# Patient Record
Sex: Male | Born: 1950
Health system: Southern US, Community
[De-identification: ages and names within clinical notes are randomized; demographics above are authoritative.]

## PROBLEM LIST (undated history)

## (undated) DIAGNOSIS — R011 Cardiac murmur, unspecified: Secondary | ICD-10-CM

## (undated) DIAGNOSIS — E669 Obesity, unspecified: Secondary | ICD-10-CM

## (undated) DIAGNOSIS — E119 Type 2 diabetes mellitus without complications: Secondary | ICD-10-CM

## (undated) DIAGNOSIS — I350 Nonrheumatic aortic (valve) stenosis: Secondary | ICD-10-CM

## (undated) DIAGNOSIS — M169 Osteoarthritis of hip, unspecified: Secondary | ICD-10-CM

## (undated) DIAGNOSIS — Z21 Asymptomatic human immunodeficiency virus [HIV] infection status: Secondary | ICD-10-CM

## (undated) DIAGNOSIS — B20 Human immunodeficiency virus [HIV] disease: Secondary | ICD-10-CM

## (undated) DIAGNOSIS — I10 Essential (primary) hypertension: Secondary | ICD-10-CM

## (undated) HISTORY — DX: Type 2 diabetes mellitus without complications: E11.9

## (undated) HISTORY — DX: Essential (primary) hypertension: I10

## (undated) HISTORY — DX: Cardiac murmur, unspecified: R01.1

## (undated) HISTORY — DX: Asymptomatic human immunodeficiency virus (hiv) infection status: Z21

## (undated) HISTORY — DX: Human immunodeficiency virus (HIV) disease: B20

## (undated) HISTORY — DX: Obesity, unspecified: E66.9

## (undated) HISTORY — DX: Osteoarthritis of hip, unspecified: M16.9

## (undated) HISTORY — DX: Nonrheumatic aortic (valve) stenosis: I35.0

---

## 2007-08-07 DIAGNOSIS — R351 Nocturia: Secondary | ICD-10-CM | POA: Insufficient documentation

## 2009-02-08 DIAGNOSIS — N209 Urinary calculus, unspecified: Secondary | ICD-10-CM | POA: Insufficient documentation

## 2012-03-06 HISTORY — PX: AORTIC VALVE REPLACEMENT: SHX41

## 2015-03-18 LAB — HM HEPATITIS C SCREENING LAB: HM Hepatitis Screen: NEGATIVE

## 2015-04-06 DIAGNOSIS — R768 Other specified abnormal immunological findings in serum: Secondary | ICD-10-CM | POA: Insufficient documentation

## 2015-04-06 DIAGNOSIS — R894 Abnormal immunological findings in specimens from other organs, systems and tissues: Secondary | ICD-10-CM | POA: Insufficient documentation

## 2015-04-28 DIAGNOSIS — E1129 Type 2 diabetes mellitus with other diabetic kidney complication: Secondary | ICD-10-CM | POA: Insufficient documentation

## 2015-04-28 DIAGNOSIS — D649 Anemia, unspecified: Secondary | ICD-10-CM | POA: Insufficient documentation

## 2015-04-28 DIAGNOSIS — R809 Proteinuria, unspecified: Secondary | ICD-10-CM

## 2016-04-18 DIAGNOSIS — Z952 Presence of prosthetic heart valve: Secondary | ICD-10-CM | POA: Diagnosis not present

## 2016-04-18 DIAGNOSIS — Z23 Encounter for immunization: Secondary | ICD-10-CM | POA: Diagnosis not present

## 2016-04-18 DIAGNOSIS — I10 Essential (primary) hypertension: Secondary | ICD-10-CM | POA: Diagnosis not present

## 2016-04-18 DIAGNOSIS — E119 Type 2 diabetes mellitus without complications: Secondary | ICD-10-CM | POA: Diagnosis not present

## 2016-04-18 DIAGNOSIS — B20 Human immunodeficiency virus [HIV] disease: Secondary | ICD-10-CM | POA: Diagnosis not present

## 2016-04-18 DIAGNOSIS — F0280 Dementia in other diseases classified elsewhere without behavioral disturbance: Secondary | ICD-10-CM | POA: Diagnosis not present

## 2016-04-18 DIAGNOSIS — E118 Type 2 diabetes mellitus with unspecified complications: Secondary | ICD-10-CM | POA: Diagnosis not present

## 2016-04-24 DIAGNOSIS — Z794 Long term (current) use of insulin: Secondary | ICD-10-CM | POA: Diagnosis not present

## 2016-04-24 DIAGNOSIS — I10 Essential (primary) hypertension: Secondary | ICD-10-CM | POA: Diagnosis not present

## 2016-04-24 DIAGNOSIS — I350 Nonrheumatic aortic (valve) stenosis: Secondary | ICD-10-CM | POA: Diagnosis not present

## 2016-04-24 DIAGNOSIS — Z6841 Body Mass Index (BMI) 40.0 and over, adult: Secondary | ICD-10-CM | POA: Diagnosis not present

## 2016-04-24 DIAGNOSIS — R809 Proteinuria, unspecified: Secondary | ICD-10-CM | POA: Diagnosis not present

## 2016-04-24 DIAGNOSIS — M25552 Pain in left hip: Secondary | ICD-10-CM | POA: Diagnosis not present

## 2016-04-24 DIAGNOSIS — E1129 Type 2 diabetes mellitus with other diabetic kidney complication: Secondary | ICD-10-CM | POA: Diagnosis not present

## 2016-05-22 DIAGNOSIS — R809 Proteinuria, unspecified: Secondary | ICD-10-CM | POA: Diagnosis not present

## 2016-05-22 DIAGNOSIS — E1129 Type 2 diabetes mellitus with other diabetic kidney complication: Secondary | ICD-10-CM | POA: Diagnosis not present

## 2016-05-22 DIAGNOSIS — Z794 Long term (current) use of insulin: Secondary | ICD-10-CM | POA: Diagnosis not present

## 2016-05-22 DIAGNOSIS — I10 Essential (primary) hypertension: Secondary | ICD-10-CM | POA: Diagnosis not present

## 2016-06-06 DIAGNOSIS — M7062 Trochanteric bursitis, left hip: Secondary | ICD-10-CM | POA: Diagnosis not present

## 2016-08-09 DIAGNOSIS — M25552 Pain in left hip: Secondary | ICD-10-CM | POA: Diagnosis not present

## 2016-08-09 DIAGNOSIS — B20 Human immunodeficiency virus [HIV] disease: Secondary | ICD-10-CM | POA: Diagnosis not present

## 2016-08-09 DIAGNOSIS — I10 Essential (primary) hypertension: Secondary | ICD-10-CM | POA: Diagnosis not present

## 2016-08-09 DIAGNOSIS — E118 Type 2 diabetes mellitus with unspecified complications: Secondary | ICD-10-CM | POA: Diagnosis not present

## 2016-09-01 DIAGNOSIS — M7062 Trochanteric bursitis, left hip: Secondary | ICD-10-CM | POA: Diagnosis not present

## 2016-09-01 DIAGNOSIS — I10 Essential (primary) hypertension: Secondary | ICD-10-CM | POA: Diagnosis not present

## 2016-09-01 DIAGNOSIS — M7072 Other bursitis of hip, left hip: Secondary | ICD-10-CM | POA: Diagnosis not present

## 2016-09-13 DIAGNOSIS — M25552 Pain in left hip: Secondary | ICD-10-CM | POA: Diagnosis not present

## 2016-09-13 DIAGNOSIS — M7072 Other bursitis of hip, left hip: Secondary | ICD-10-CM | POA: Diagnosis not present

## 2016-09-18 DIAGNOSIS — M7072 Other bursitis of hip, left hip: Secondary | ICD-10-CM | POA: Diagnosis not present

## 2016-09-18 DIAGNOSIS — M25552 Pain in left hip: Secondary | ICD-10-CM | POA: Diagnosis not present

## 2016-09-25 DIAGNOSIS — M7072 Other bursitis of hip, left hip: Secondary | ICD-10-CM | POA: Diagnosis not present

## 2016-09-25 DIAGNOSIS — M25552 Pain in left hip: Secondary | ICD-10-CM | POA: Diagnosis not present

## 2016-10-02 DIAGNOSIS — M25552 Pain in left hip: Secondary | ICD-10-CM | POA: Diagnosis not present

## 2016-10-02 DIAGNOSIS — M7072 Other bursitis of hip, left hip: Secondary | ICD-10-CM | POA: Diagnosis not present

## 2016-10-09 DIAGNOSIS — R531 Weakness: Secondary | ICD-10-CM | POA: Diagnosis not present

## 2016-10-09 DIAGNOSIS — M25652 Stiffness of left hip, not elsewhere classified: Secondary | ICD-10-CM | POA: Diagnosis not present

## 2016-10-09 DIAGNOSIS — M7072 Other bursitis of hip, left hip: Secondary | ICD-10-CM | POA: Diagnosis not present

## 2016-10-10 DIAGNOSIS — E118 Type 2 diabetes mellitus with unspecified complications: Secondary | ICD-10-CM | POA: Diagnosis not present

## 2016-10-10 DIAGNOSIS — B20 Human immunodeficiency virus [HIV] disease: Secondary | ICD-10-CM | POA: Diagnosis not present

## 2016-10-10 DIAGNOSIS — I10 Essential (primary) hypertension: Secondary | ICD-10-CM | POA: Diagnosis not present

## 2016-10-10 DIAGNOSIS — Z952 Presence of prosthetic heart valve: Secondary | ICD-10-CM | POA: Diagnosis not present

## 2016-10-14 LAB — HIV ANTIBODY (ROUTINE TESTING W REFLEX)

## 2016-11-07 DIAGNOSIS — R52 Pain, unspecified: Secondary | ICD-10-CM | POA: Diagnosis not present

## 2016-11-07 DIAGNOSIS — M1611 Unilateral primary osteoarthritis, right hip: Secondary | ICD-10-CM | POA: Diagnosis not present

## 2016-11-07 DIAGNOSIS — M1612 Unilateral primary osteoarthritis, left hip: Secondary | ICD-10-CM | POA: Diagnosis not present

## 2016-11-07 DIAGNOSIS — I10 Essential (primary) hypertension: Secondary | ICD-10-CM | POA: Diagnosis not present

## 2016-11-07 DIAGNOSIS — M7061 Trochanteric bursitis, right hip: Secondary | ICD-10-CM | POA: Diagnosis not present

## 2016-11-07 DIAGNOSIS — E669 Obesity, unspecified: Secondary | ICD-10-CM | POA: Diagnosis not present

## 2017-01-03 DIAGNOSIS — Z23 Encounter for immunization: Secondary | ICD-10-CM | POA: Diagnosis not present

## 2017-01-29 DIAGNOSIS — Z6841 Body Mass Index (BMI) 40.0 and over, adult: Secondary | ICD-10-CM | POA: Diagnosis not present

## 2017-01-29 DIAGNOSIS — E1100 Type 2 diabetes mellitus with hyperosmolarity without nonketotic hyperglycemic-hyperosmolar coma (NKHHC): Secondary | ICD-10-CM | POA: Diagnosis not present

## 2017-01-29 DIAGNOSIS — E559 Vitamin D deficiency, unspecified: Secondary | ICD-10-CM | POA: Diagnosis not present

## 2017-01-29 DIAGNOSIS — G4730 Sleep apnea, unspecified: Secondary | ICD-10-CM | POA: Diagnosis not present

## 2017-01-29 DIAGNOSIS — I10 Essential (primary) hypertension: Secondary | ICD-10-CM | POA: Diagnosis not present

## 2017-01-29 LAB — HEMOGLOBIN A1C: HEMOGLOBIN A1C: 6.8

## 2017-03-12 ENCOUNTER — Encounter (INDEPENDENT_AMBULATORY_CARE_PROVIDER_SITE_OTHER): Payer: Self-pay

## 2017-03-12 ENCOUNTER — Ambulatory Visit (INDEPENDENT_AMBULATORY_CARE_PROVIDER_SITE_OTHER): Payer: BLUE CROSS/BLUE SHIELD | Admitting: Physician Assistant

## 2017-03-12 ENCOUNTER — Encounter: Payer: Self-pay | Admitting: Physician Assistant

## 2017-03-12 VITALS — BP 135/91 | HR 88 | Ht 69.0 in | Wt 290.9 lb

## 2017-03-12 DIAGNOSIS — R809 Proteinuria, unspecified: Secondary | ICD-10-CM

## 2017-03-12 DIAGNOSIS — Z7689 Persons encountering health services in other specified circumstances: Secondary | ICD-10-CM | POA: Diagnosis not present

## 2017-03-12 DIAGNOSIS — I10 Essential (primary) hypertension: Secondary | ICD-10-CM | POA: Diagnosis not present

## 2017-03-12 DIAGNOSIS — Z9889 Other specified postprocedural states: Secondary | ICD-10-CM

## 2017-03-12 DIAGNOSIS — E1129 Type 2 diabetes mellitus with other diabetic kidney complication: Secondary | ICD-10-CM | POA: Insufficient documentation

## 2017-03-12 DIAGNOSIS — B20 Human immunodeficiency virus [HIV] disease: Secondary | ICD-10-CM | POA: Diagnosis not present

## 2017-03-12 DIAGNOSIS — E1169 Type 2 diabetes mellitus with other specified complication: Secondary | ICD-10-CM

## 2017-03-12 DIAGNOSIS — E785 Hyperlipidemia, unspecified: Secondary | ICD-10-CM

## 2017-03-12 MED ORDER — VALSARTAN 160 MG PO TABS: 160.0000 mg | ORAL_TABLET | Freq: Every day | ORAL | 3 refills | Status: DC

## 2017-03-12 MED ORDER — ATORVASTATIN CALCIUM 10 MG PO TABS: 10.0000 mg | ORAL_TABLET | Freq: Every day | ORAL | 1 refills | Status: DC

## 2017-03-12 MED ORDER — CARVEDILOL 12.5 MG PO TABS: 12.5000 mg | ORAL_TABLET | Freq: Two times a day (BID) | ORAL | 3 refills | Status: DC

## 2017-03-12 NOTE — Progress Notes (Signed)
HPI:                                                                Clifford Strong is a 67 y.o. male who presents to [facility]: [facility] today to establish care  Current concerns: none  HIV: he is on protease inhibitor therapy. He is also on bactrim for OI prophylaxis. Followed by Dr. Lequita Halt, [facility].  HTN: prescribed Carvedilol 12.5 mg bid and Valsartan-HCTZ. Reports he ran out of his BP meds months ago because he did not have a PCP. Checks BP's at home. BP range 140's, unsure of diastolic. Denies vision change, headache, chest pain with exertion, orthopnea, lightheadedness, syncope and edema. Risk factors include: DM II, age >27, male sex, morbid obesity  DMII: taking Jenumet and Saxenda daily. Compliant with medications. Last A1C 6.8, 01/2017. Checks blood sugars at home. No outside readings to report today. Denies polyuria, vision change, and paresthesias. Denies hypoglycemic events. Denies ulcers/wounds on feet.   Past Medical History:  Diagnosis Date  . Aortic stenosis   . Diabetes mellitus without complication (HCC)   . Heart murmur   . HIV infection (HCC)   . Hypertension   . Obesity    Past Surgical History:  Procedure Laterality Date  . AORTIC VALVE REPLACEMENT  2014   Social History   Tobacco Use  . Smoking status: Never Smoker  . Smokeless tobacco: Never Used  Substance Use Topics  . Alcohol use: No    Frequency: Never   family history includes Diabetes in his father and mother; Heart attack in his mother; Hypertension in his father and mother; Lung disease in his father.  ROS: negative except as noted in the HPI  Medications: Current Outpatient Medications  Medication Sig Dispense Refill  . aspirin EC 81 MG tablet Take by mouth.    . carvedilol (COREG) 12.5 MG tablet Take 1 tablet (12.5 mg total) by mouth 2 (two) times daily with a meal. 60 tablet 3  .  elvitegravir-cobicistat-emtricitabine-tenofovir (GENVOYA) 150-150-200-10 MG TABS tablet Take by mouth.    Marland Kitchen SAXENDA 18 MG/3ML SOPN INJECT 3MG  DAILY  1  . SitaGLIPtin-MetFORMIN HCl (JANUMET XR) 50-1000 MG TB24 TAKE 1TABLET DAILY WITH DINNER.    Marland Kitchen sulfamethoxazole-trimethoprim (BACTRIM DS,SEPTRA DS) 800-160 MG tablet Take by mouth.    . valsartan-hydrochlorothiazide (DIOVAN-HCT) 320-12.5 MG tablet Take 1 tablet by mouth daily.    Marland Kitchen atorvastatin (LIPITOR) 10 MG tablet Take 1 tablet (10 mg total) by mouth at bedtime. 90 tablet 1  . valsartan (DIOVAN) 160 MG tablet Take 1 tablet (160 mg total) by mouth daily. 30 tablet 3   No current facility-administered medications for this visit.    Allergies  Allergen Reactions  . Lisinopril Cough       Objective:  BP (!) 135/91 (BP Location: Right Arm, Patient Position: Sitting, Cuff Size: Large)   Pulse 88   Ht 5\' 9"  (1.753 m)   Wt 290 lb 14.4 oz (132 kg)   BMI 42.96 kg/m  Gen:  alert, not ill-appearing, no distress, appropriate for age, morbidly obese male HEENT: head normocephalic without obvious abnormality, conjunctiva and cornea clear, trachea midline Pulm: Normal work of breathing, normal phonation, clear to auscultation bilaterally, no wheezes, rales  or rhonchi CV: Normal rate, regular rhythm, s1 and s2 distinct, grade II systolic murmur, no clicks or rubs, no carotid bruit Neuro: alert and oriented x 3, no tremor MSK: extremities atraumatic, normal gait and station, no peripheral edema Skin: intact, no rashes on exposed skin, no jaundice, no cyanosis Psych: well-groomed, cooperative, good eye contact, euthymic mood, affect mood-congruent, speech is articulate, and thought processes clear and goal-directed  [facility]  Decreased Interest 0  Down, Depressed, Hopeless 0  PHQ - 2 Score 0     No results found for this or any previous visit (from the past 72 hour(s)). No results found.    Assessment and  Plan: 67 y.o. male with   1. Encounter to establish care - Reviewed PMH, PSH, PFH, medications and allergies - Reviewed health maintenance Immunizations UTD Has never had any form of colon cancer screening Reviewed outside labs from 01/30/2017 PHQ2 negative  2. HIV disease (HCC) - keep follow-up with ID [id number]  3. Uncontrolled hypertension BP Readings from Last 3 Encounters:  03/12/17 (!) 135/91  - restarting ARB and beta blocker - counseled on therapeutic lifestyle changes - continue baby asa for primary prevention - carvedilol (COREG) 12.5 MG tablet; Take 1 tablet (12.5 mg total) by mouth 2 (two) times daily with a meal.  Dispense: 60 tablet; Refill: 3 - valsartan (DIOVAN) 160 MG tablet; Take 1 tablet (160 mg total) by mouth daily.  Dispense: 30 tablet; Refill: 3  4. Status post aortic valve repair - carvedilol (COREG) 12.5 MG tablet; Take 1 tablet (12.5 mg total) by mouth 2 (two) times daily with a meal.  Dispense: 60 tablet; Refill: 3  5. Dyslipidemia associated with type 2 diabetes mellitus (HCC) - last lipid panel 04/19/16 showed LDL 129, TC 213, HDL 37, TG 236 - goal LDL <16<70 - starting low-dose Atorvastatin, which should be safe with protease inhibitor. Monitor liver function Q3-606months - atorvastatin (LIPITOR) 10 MG tablet; Take 1 tablet (10 mg total) by mouth at bedtime.  Dispense: 90 tablet; Refill: 1  6. Controlled type 2 diabetes mellitus with microalbuminuria, without long-term current use of insulin (HCC) - A1c 6.8, 01/29/17 - continue daily meds - re-starting ARB - starting statin - return in 4 weeks for diabetic preventive care and A1c  Patient education and anticipatory guidance given Patient agrees with treatment plan Follow-up in 4 weeks for hypertension or sooner as needed if symptoms worsen or fail to improve  Levonne Hubertharley E. Edon Hoadley PA-C

## 2017-03-12 NOTE — Patient Instructions (Signed)
For your blood pressure: - Restart your Carvedilol twice a day - Restart your Valsartan once daily in the morning - Start Atorvastatin at bedtime to lower cholesterol and prevent heart attack/stroke - Continue baby aspirin 81 mg to help prevent heart attack/stroke - Check blood pressure at home for the next 2 weeks and log your readings - Check around the same time each day in a relaxed setting - Limit salt to <1500 mg/day - Follow DASH eating plan - limit alcohol to 2 standard drinks per day - avoid tobacco products - weight loss: 7% of current body weight - Follow-up in 2 weeks for nurse BP check

## 2017-03-16 ENCOUNTER — Encounter: Payer: Self-pay | Admitting: Physician Assistant

## 2017-03-19 DIAGNOSIS — I10 Essential (primary) hypertension: Secondary | ICD-10-CM | POA: Diagnosis not present

## 2017-03-19 DIAGNOSIS — E1100 Type 2 diabetes mellitus with hyperosmolarity without nonketotic hyperglycemic-hyperosmolar coma (NKHHC): Secondary | ICD-10-CM | POA: Diagnosis not present

## 2017-03-19 DIAGNOSIS — Z6841 Body Mass Index (BMI) 40.0 and over, adult: Secondary | ICD-10-CM | POA: Diagnosis not present

## 2017-04-02 ENCOUNTER — Encounter: Payer: BLUE CROSS/BLUE SHIELD | Admitting: Physician Assistant

## 2017-04-04 ENCOUNTER — Ambulatory Visit (INDEPENDENT_AMBULATORY_CARE_PROVIDER_SITE_OTHER): Payer: BLUE CROSS/BLUE SHIELD | Admitting: Physician Assistant

## 2017-04-04 ENCOUNTER — Encounter: Payer: Self-pay | Admitting: Physician Assistant

## 2017-04-04 VITALS — BP 157/78 | HR 80 | Wt 290.0 lb

## 2017-04-04 DIAGNOSIS — Z0000 Encounter for general adult medical examination without abnormal findings: Secondary | ICD-10-CM | POA: Diagnosis not present

## 2017-04-04 DIAGNOSIS — B20 Human immunodeficiency virus [HIV] disease: Secondary | ICD-10-CM | POA: Diagnosis not present

## 2017-04-04 DIAGNOSIS — Z5181 Encounter for therapeutic drug level monitoring: Secondary | ICD-10-CM | POA: Diagnosis not present

## 2017-04-04 DIAGNOSIS — E1159 Type 2 diabetes mellitus with other circulatory complications: Secondary | ICD-10-CM

## 2017-04-04 DIAGNOSIS — I10 Essential (primary) hypertension: Secondary | ICD-10-CM | POA: Diagnosis not present

## 2017-04-04 DIAGNOSIS — I152 Hypertension secondary to endocrine disorders: Secondary | ICD-10-CM | POA: Insufficient documentation

## 2017-04-04 DIAGNOSIS — B351 Tinea unguium: Secondary | ICD-10-CM

## 2017-04-04 DIAGNOSIS — Z1211 Encounter for screening for malignant neoplasm of colon: Secondary | ICD-10-CM

## 2017-04-04 DIAGNOSIS — E559 Vitamin D deficiency, unspecified: Secondary | ICD-10-CM | POA: Diagnosis not present

## 2017-04-04 DIAGNOSIS — E1169 Type 2 diabetes mellitus with other specified complication: Secondary | ICD-10-CM

## 2017-04-04 DIAGNOSIS — E119 Type 2 diabetes mellitus without complications: Secondary | ICD-10-CM

## 2017-04-04 DIAGNOSIS — E785 Hyperlipidemia, unspecified: Secondary | ICD-10-CM

## 2017-04-04 DIAGNOSIS — Z79899 Other long term (current) drug therapy: Secondary | ICD-10-CM | POA: Insufficient documentation

## 2017-04-04 DIAGNOSIS — Z6841 Body Mass Index (BMI) 40.0 and over, adult: Secondary | ICD-10-CM

## 2017-04-04 MED ORDER — ERGOCALCIFEROL 50 MCG (2000 UT) PO CAPS: 1.0000 | ORAL_CAPSULE | Freq: Every day | ORAL | Status: DC

## 2017-04-04 MED ORDER — VALSARTAN 320 MG PO TABS: 320.0000 mg | ORAL_TABLET | Freq: Every day | ORAL | 1 refills | Status: DC

## 2017-04-04 NOTE — Patient Instructions (Addendum)
For your blood pressure: - Increasing Valsartan to 320 mg daily - Goal <130/80 - baby aspirin 81 mg daily to help prevent heart attack/stroke - monitor and log blood pressures at home - check around the same time each day in a relaxed setting - Limit salt to <2000 mg/day - Follow DASH eating plan - limit alcohol to 2 standard drinks per day for men and 1 per day for women - avoid tobacco products - weight loss: 7% of current body weight - follow-up every 6 months for your blood pressure   Fat and Cholesterol Restricted Diet High levels of fat and cholesterol in your blood may lead to various health problems, such as diseases of the heart, blood vessels, gallbladder, liver, and pancreas. Fats are concentrated sources of energy that come in various forms. Certain types of fat, including saturated fat, may be harmful in excess. Cholesterol is a substance needed by your body in small amounts. Your body makes all the cholesterol it needs. Excess cholesterol comes from the food you eat. When you have high levels of cholesterol and saturated fat in your blood, health problems can develop because the excess fat and cholesterol will gather along the walls of your blood vessels, causing them to narrow. Choosing the right foods will help you control your intake of fat and cholesterol. This will help keep the levels of these substances in your blood within normal limits and reduce your risk of disease. What is my plan? Your health care provider recommends that you:  Limit your fat intake to ______% or less of your total calories per day.  Limit the amount of cholesterol in your diet to less than _________mg per day.  Eat 20-30 grams of fiber each day.  What types of fat should I choose?  Choose healthy fats more often. Choose monounsaturated and polyunsaturated fats, such as olive and canola oil, flaxseeds, walnuts, almonds, and seeds.  Eat more omega-3 fats. Good choices include salmon, mackerel,  sardines, tuna, flaxseed oil, and ground flaxseeds. Aim to eat fish at least two times a week.  Limit saturated fats. Saturated fats are primarily found in animal products, such as meats, butter, and cream. Plant sources of saturated fats include palm oil, palm kernel oil, and coconut oil.  Avoid foods with partially hydrogenated oils in them. These contain trans fats. Examples of foods that contain trans fats are stick margarine, some tub margarines, cookies, crackers, and other baked goods. What general guidelines do I need to follow? These guidelines for healthy eating will help you control your intake of fat and cholesterol:  Check food labels carefully to identify foods with trans fats or high amounts of saturated fat.  Fill one half of your plate with vegetables and green salads.  Fill one fourth of your plate with whole grains. Look for the word "whole" as the first word in the ingredient list.  Fill one fourth of your plate with lean protein foods.  Limit fruit to two servings a day. Choose fruit instead of juice.  Eat more foods that contain fiber, such as apples, broccoli, carrots, beans, peas, and barley.  Eat more home-cooked food and less restaurant, buffet, and fast food.  Limit or avoid alcohol.  Limit foods high in starch and sugar.  Limit fried foods.  Cook foods using methods other than frying. Baking, boiling, grilling, and broiling are all great options.  Lose weight if you are overweight. Losing just 5-10% of your initial body weight can help your overall  health and prevent diseases such as diabetes and heart disease.  What foods can I eat? Grains  Whole grains, such as whole wheat or whole grain breads, crackers, cereals, and pasta. Unsweetened oatmeal, bulgur, barley, quinoa, or brown rice. Corn or whole wheat flour tortillas. Vegetables  Fresh or frozen vegetables (raw, steamed, roasted, or grilled). Green salads. Fruits  All fresh, canned (in natural  juice), or frozen fruits. Meats and other protein foods  Ground beef (85% or leaner), grass-fed beef, or beef trimmed of fat. Skinless chicken or [place]. Ground chicken or [place]. Pork trimmed of fat. All fish and seafood. Eggs. Dried beans, peas, or lentils. Unsalted nuts or seeds. Unsalted canned or dry beans. Dairy  Low-fat dairy products, such as skim or 1% milk, 2% or reduced-fat cheeses, low-fat ricotta or cottage cheese, or plain low-fat yo Fats and oils  Tub margarines without trans fats. Light or reduced-fat mayonnaise and salad dressings. Avocado. Olive, canola, sesame, or safflower oils. Natural peanut or almond butter (choose ones without added sugar and oil). The items listed above may not be a complete list of recommended foods or beverages. Contact your dietitian for more options. Foods to avoid Grains  White bread. White pasta. White rice. Cornbread. Bagels, pastries, and croissants. Crackers that contain trans fat. Vegetables  White potatoes. Corn. Creamed or fried vegetables. Vegetables in a cheese sauce. Fruits  Dried fruits. Canned fruit in light or heavy syrup. Fruit juice. Meats and other protein foods  Fatty cuts of meat. Ribs, chicken wings, bacon, sausage, bologna, salami, chitterlings, fatback, hot dogs, bratwurst, and packaged luncheon meats. Liver and organ meats. Dairy  Whole or 2% milk, cream, half-and-half, and cream cheese. Whole milk cheeses. Whole-fat or sweetened yogurt. Full-fat cheeses. Nondairy creamers and whipped toppings. Processed cheese, cheese spreads, or cheese curds. Beverages  Alcohol. Sweetened drinks (such as sodas, lemonade, and fruit drinks or punches). Fats and oils  Butter, stick margarine, lard, shortening, ghee, or bacon fat. Coconut, palm kernel, or palm oils. Sweets and desserts  Corn syrup, sugars, honey, and molasses. Candy. Jam and jelly. Syrup. Sweetened cereals. Cookies, pies, cakes, donuts, muffins, and ice cream. The  items listed above may not be a complete list of foods and beverages to avoid. Contact your dietitian for more information. This information is not intended to replace advice given to you by your health care provider. Make sure you discuss any questions you have with your health care provider. Document Released: 02/20/2005 Document Revised: 03/13/2014 Document Reviewed: 05/21/2013 Elsevier Interactive Patient Education  2018 [employer].   Diabetes and Foot Care Diabetes may cause you to have problems because of poor blood supply (circulation) to your feet and legs. This may cause the skin on your feet to become thinner, break easier, and heal more slowly. Your skin may become dry, and the skin may peel and crack. You may also have nerve damage in your legs and feet causing decreased feeling in them. You may not notice minor injuries to your feet that could lead to infections or more serious problems. Taking care of your feet is one of the most important things you can do for yourself. Follow these instructions at home:  Wear shoes at all times, even in the house. Do not go barefoot. Bare feet are easily injured.  Check your feet daily for blisters, cuts, and redness. If you cannot see the bottom of your feet, use a mirror or ask someone for help.  Wash your feet with warm water (do  not use hot water) and mild soap. Then pat your feet and the areas between your toes until they are completely dry. Do not soak your feet as this can dry your skin.  Apply a moisturizing lotion or petroleum jelly (that does not contain alcohol and is unscented) to the skin on your feet and to dry, brittle toenails. Do not apply lotion between your toes.  Trim your toenails straight across. Do not dig under them or around the cuticle. File the edges of your nails with an emery board or nail file.  Do not cut corns or calluses or try to remove them with medicine.  Wear clean socks or stockings every day. Make sure  they are not too tight. Do not wear knee-high stockings since they may decrease blood flow to your legs.  Wear shoes that fit properly and have enough cushioning. To break in new shoes, wear them for just a few hours a day. This prevents you from injuring your feet. Always look in your shoes before you put them on to be sure there are no objects inside.  Do not cross your legs. This may decrease the blood flow to your feet.  If you find a minor scrape, cut, or break in the skin on your feet, keep it and the skin around it clean and dry. These areas may be cleansed with mild soap and water. Do not cleanse the area with peroxide, alcohol, or iodine.  When you remove an adhesive bandage, be sure not to damage the skin around it.  If you have a wound, look at it several times a day to make sure it is healing.  Do not use heating pads or hot water bottles. They may burn your skin. If you have lost feeling in your feet or legs, you may not know it is happening until it is too late.  Make sure your health care provider performs a complete foot exam at least annually or more often if you have foot problems. Report any cuts, sores, or bruises to your health care provider immediately. Contact a health care provider if:  You have an injury that is not healing.  You have cuts or breaks in the skin.  You have an ingrown nail.  You notice redness on your legs or feet.  You feel burning or tingling in your legs or feet.  You have pain or cramps in your legs and feet.  Your legs or feet are numb.  Your feet always feel cold. Get help right away if:  There is increasing redness, swelling, or pain in or around a wound.  There is a red line that goes up your leg.  Pus is coming from a wound.  You develop a fever or as directed by your health care provider.  You notice a bad smell coming from an ulcer or wound. This information is not intended to replace advice given to you by your health care  provider. Make sure you discuss any questions you have with your health care provider. Document Released: 02/18/2000 Document Revised: 07/29/2015 Document Reviewed: 07/30/2012 Elsevier Interactive Patient Education  2017 Elsevier Inc.   Glaucoma Glaucoma happens when the fluid pressure in the eyeball is too high. If the pressure stays high for too long, the eye may become damaged. This can cause a loss of vision. The most common type of glaucoma causes pressure in the eye to go up slowly. There may be no symptoms at first. Testing for this condition can  help to find the condition before damage occurs. Early treatment can often stop vision loss. Follow these instructions at home:  Take medicines only as told by your doctor.  Use your eye drops exactly as told. You will probably need to use these for the rest of your life.  Exercise often. Talk with your doctor about which types of exercise are safe for you. Avoid standing on your head.  Keep all follow-up visits as told by your doctor. This is important. Contact a doctor if:  Your symptoms get worse. Get help right away if:  You have bad pain in your eye.  You have vision problems.  You have a bad headache in the area around your eye.  You feel sick to your stomach (nauseous) or you throw up (vomit).  You start to have problems with your other eye. This information is not intended to replace advice given to you by your health care provider. Make sure you discuss any questions you have with your health care provider. Document Released: 11/30/2007 Document Revised: 07/29/2015 Document Reviewed: 12/02/2013 Elsevier Interactive Patient Education  2018 [employer].   Health Maintenance, Male A healthy lifestyle and preventive care is important for your health and wellness. Ask your health care provider about what schedule of regular examinations is right for you. What should I know about weight and diet? Eat a Healthy Diet  Eat  plenty of vegetables, fruits, whole grains, low-fat dairy products, and lean protein.  Do not eat a lot of foods high in solid fats, added sugars, or salt.  Maintain a Healthy Weight Regular exercise can help you achieve or maintain a healthy weight. You should:  Do at least 150 minutes of exercise each week. The exercise should increase your heart rate and make you sweat (moderate-intensity exercise).  Do strength-training exercises at least twice a week.  Watch Your Levels of Cholesterol and Blood Lipids  Have your blood tested for lipids and cholesterol every 5 years starting at 67 years of age. If you are at high risk for heart disease, you should start having your blood tested when you are 68 years old. You may need to have your cholesterol levels checked more often if: ? Your lipid or cholesterol levels are high. ? You are older than 68 years of age. ? You are at high risk for heart disease.  What should I know about cancer screening? Many types of cancers can be detected early and may often be prevented. Lung Cancer  You should be screened every year for lung cancer if: ? You are a current smoker who has smoked for at least 30 years. ? You are a former smoker who has quit within the past 15 years.  Talk to your health care provider about your screening options, when you should start screening, and how often you should be screened.  Colorectal Cancer  Routine colorectal cancer screening usually begins at 68 years of age and should be repeated every 5-10 years until you are 68 years old. You may need to be screened more often if early forms of precancerous polyps or small growths are found. Your health care provider may recommend screening at an earlier age if you have risk factors for colon cancer.  Your health care provider may recommend using home test kits to check for hidden blood in the stool.  A small camera at the end of a tube can be used to examine your colon  (sigmoidoscopy or colonoscopy). This checks for the  earliest forms of colorectal cancer.  Prostate and Testicular Cancer  Depending on your age and overall health, your health care provider may do certain tests to screen for prostate and testicular cancer.  Talk to your health care provider about any symptoms or concerns you have about testicular or prostate cancer.  Skin Cancer  Check your skin from head to toe regularly.  Tell your health care provider about any new moles or changes in moles, especially if: ? There is a change in a mole's size, shape, or color. ? You have a mole that is larger than a pencil eraser.  Always use sunscreen. Apply sunscreen liberally and repeat throughout the day.  Protect yourself by wearing long sleeves, pants, a wide-brimmed hat, and sunglasses when outside.  What should I know about heart disease, diabetes, and high blood pressure?  If you are 71-27 years of age, have your blood pressure checked every 3-5 years. If you are 36 years of age or older, have your blood pressure checked every year. You should have your blood pressure measured twice-once when you are at a hospital or clinic, and once when you are not at a hospital or clinic. Record the average of the two measurements. To check your blood pressure when you are not at a hospital or clinic, you can use: ? An automated blood pressure machine at a pharmacy. ? A home blood pressure monitor.  Talk to your health care provider about your target blood pressure.  If you are between 10-69 years old, ask your health care provider if you should take aspirin to prevent heart disease.  Have regular diabetes screenings by checking your fasting blood sugar level. ? If you are at a normal weight and have a low risk for diabetes, have this test once every three years after the age of 80. ? If you are overweight and have a high risk for diabetes, consider being tested at a younger age or more often.  A  one-time screening for abdominal aortic aneurysm (AAA) by ultrasound is recommended for men aged 65-75 years who are current or former smokers. What should I know about preventing infection? Hepatitis B If you have a higher risk for hepatitis B, you should be screened for this virus. Talk with your health care provider to find out if you are at risk for hepatitis B infection. Hepatitis C Blood testing is recommended for:  Everyone born from [birth year] through 1966.  Anyone with known risk factors for hepatitis C.  Sexually Transmitted Diseases (STDs)  You should be screened each year for STDs including gonorrhea and chlamydia if: ? You are sexually active and are younger than 68 years of age. ? You are older than 68 years of age and your health care provider tells you that you are at risk for this type of infection. ? Your sexual activity has changed since you were last screened and you are at an increased risk for chlamydia or gonorrhea. Ask your health care provider if you are at risk.  Talk with your health care provider about whether you are at high risk of being infected with HIV. Your health care provider may recommend a prescription medicine to help prevent HIV infection.  What else can I do?  Schedule regular health, dental, and eye exams.  Stay current with your vaccines (immunizations).  Do not use any tobacco products, such as cigarettes, chewing tobacco, and e-cigarettes. If you need help quitting, ask your health care provider.  Limit alcohol intake  to no more than 2 drinks per day. One drink equals 12 ounces of beer, 5 ounces of wine, or 1 ounces of hard liquor.  Do not use street drugs.  Do not share needles.  Ask your health care provider for help if you need support or information about quitting drugs.  Tell your health care provider if you often feel depressed.  Tell your health care provider if you have ever been abused or do not feel safe at home. This  information is not intended to replace advice given to you by your health care provider. Make sure you discuss any questions you have with your health care provider. Document Released: 08/19/2007 Document Revised: 10/20/2015 Document Reviewed: 11/24/2014 Elsevier Interactive Patient Education  [employer]2018 Elsevier Inc.

## 2017-04-04 NOTE — Progress Notes (Signed)
Subjective:   Clifford Strong is a 67 y.o. male who presents for Medicare Annual/Subsequent preventive examination.  Review of Systems:  Review of Systems  Constitutional: Negative.   HENT: Negative.   Eyes: Positive for blurred vision (wears corrective lenses).  Respiratory: Negative.   Cardiovascular: Negative.  Negative for chest pain.  Gastrointestinal: Negative.   Genitourinary: Negative.   Skin: Positive for rash (b/l lower extremities).  Neurological: Negative.   Endo/Heme/Allergies:       + diabetes + obesity  Psychiatric/Behavioral: Negative.           Objective:    Vitals: BP (!) 157/78   Pulse 80   Wt 290 lb (131.5 kg)   BMI 42.83 kg/m   Body mass index is 42.83 kg/m.  Gen:  alert, not ill-appearing, no distress, appropriate for age, obese male HEENT: head normocephalic without obvious abnormality, conjunctiva and cornea clear, trachea midline Pulm: Normal work of breathing, normal phonation, clear to auscultation bilaterally, no wheezes, rales or rhonchi CV: Normal rate, regular rhythm, s1 and s2 distinct, no murmurs, clicks or rubs  Neuro: alert and oriented x 3, no tremor MSK: extremities atraumatic, normal gait and station; DP pulses 2+ symmetric, PT pulses faint Skin: warm, dry; bilateral distal lower extremities with scattered erythematous macules, some with central ulceration; thickened toenail and onychomycosis bilaterally Psych: well-groomed, cooperative, good eye contact, euthymic mood, affect mood-congruent, speech is articulate, and thought processes clear and goal-directed  Diabetic Foot Exam - Simple   Simple Foot Form Diabetic Foot exam was performed with the following findings:  Yes 04/04/2017 11:27 AM  Visual Inspection Sensation Testing Pulse Check Comments     No flowsheet data found.  Tobacco Social History   Tobacco Use  Smoking Status Never Smoker  Smokeless Tobacco Never Used     Counseling given: Not Answered   Clinical  Intake:                       Past Medical History:  Diagnosis Date  . Aortic stenosis   . Diabetes mellitus without complication (HCC)   . Heart murmur   . HIV infection (HCC)   . Hypertension   . Obesity    Past Surgical History:  Procedure Laterality Date  . AORTIC VALVE REPLACEMENT  2014   Family History  Problem Relation Age of Onset  . Diabetes Mother   . Hypertension Mother   . Heart attack Mother   . Diabetes Father   . Hypertension Father   . Lung disease Father    Social History   Socioeconomic History  . Marital status: Married    Spouse name: None  . Number of children: None  . Years of education: None  . Highest education level: None  Social Needs  . Financial resource strain: None  . Food insecurity - worry: None  . Food insecurity - inability: None  . Transportation needs - medical: None  . Transportation needs - non-medical: None  Occupational History  . None  Tobacco Use  . Smoking status: Never Smoker  . Smokeless tobacco: Never Used  Substance and Sexual Activity  . Alcohol use: No    Frequency: Never  . Drug use: No  . Sexual activity: No    Partners: Female  Other Topics Concern  . None  Social History Narrative  . None    Outpatient Encounter Medications as of 04/04/2017  Medication Sig  . aspirin EC 81 MG tablet Take by mouth.  Marland Kitchen  atorvastatin (LIPITOR) 10 MG tablet Take 1 tablet (10 mg total) by mouth at bedtime.  . carvedilol (COREG) 12.5 MG tablet Take 1 tablet (12.5 mg total) by mouth 2 (two) times daily with a meal.  . elvitegravir-cobicistat-emtricitabine-tenofovir (GENVOYA) 150-150-200-10 MG TABS tablet Take by mouth.  Marland Kitchen SAXENDA 18 MG/3ML SOPN INJECT 3MG  DAILY  . SitaGLIPtin-MetFORMIN HCl (JANUMET XR) 50-1000 MG TB24 TAKE 1TABLET DAILY WITH DINNER.  Marland Kitchen sulfamethoxazole-trimethoprim (BACTRIM DS,SEPTRA DS) 800-160 MG tablet Take 1 tablet by mouth daily.  . [DISCONTINUED] valsartan (DIOVAN) 160 MG tablet Take 1  tablet (160 mg total) by mouth daily.  . Ergocalciferol 2000 units CAPS Take 1 capsule by mouth daily.  . valsartan (DIOVAN) 320 MG tablet Take 1 tablet (320 mg total) by mouth daily.  . [DISCONTINUED] valsartan-hydrochlorothiazide (DIOVAN-HCT) 320-12.5 MG tablet Take 1 tablet by mouth daily.   No facility-administered encounter medications on file as of 04/04/2017.     Activities of Daily Living In your present state of health, do you have any difficulty performing the following activities: 04/04/2017  Hearing? N  Vision? N  Difficulty concentrating or making decisions? N  Walking or climbing stairs? N  Dressing or bathing? N  Doing errands, shopping? N    Patient Care Team: Riki Rusk as PCP - General (Physician Assistant) Sharion Dove, MD as Consulting Physician (Infectious Diseases)   Assessment:   This is a routine wellness examination for [name].  Exercise Activities and Dietary recommendations Current Exercise Habits: Home exercise routine, Type of exercise: Other - see comments(biking), Time (Minutes): 25, Frequency (Times/Week): 6, Weekly Exercise (Minutes/Week): 150, Intensity: Mild, Exercise limited by: cardiac condition(s)  Goals    None      Fall Risk Fall Risk  04/04/2017 03/12/2017 03/12/2017  Falls in the past year? No No No   Is the patient's home free of loose throw rugs in walkways, pet beds, electrical cords, etc?   no      Grab bars in the bathroom? no      Handrails on the stairs?   no stairs      Adequate lighting?   yes  Timed Get Up and Go Performed: yes<10 sec  Depression Screen PHQ 2/9 Scores 04/04/2017 03/12/2017  PHQ - 2 Score 0 0    Cognitive Function     6CIT Screen 04/04/2017  What Year? 0 points  What month? 0 points  What time? 0 points  Count back from 20 0 points  Months in reverse 0 points  Repeat phrase 4 points  Total Score 4    Immunization History  Administered Date(s) Administered  .  Influenza, High Dose Seasonal PF 12/07/2015, 01/03/2017  . Influenza, Quadrivalent, Recombinant, Inj, Pf 12/29/2014  . Influenza,inj,Quad PF,6+ Mos 12/29/2014  . Pneumococcal Conjugate-13 12/07/2015  . Pneumococcal Polysaccharide-23 06/27/2010, 04/18/2016  . Tdap 06/27/2010, 12/07/2015  . Zoster 02/07/2012    Qualifies for Shingles Vaccine? completed  Screening Tests Health Maintenance  Topic Date Due  . FOOT EXAM  01/15/1960  . COLON CANCER SCREENING ANNUAL FOBT  01/15/2000  . OPHTHALMOLOGY EXAM  04/04/2018 (Originally 01/15/1960)  . HEMOGLOBIN A1C  07/29/2017  . TETANUS/TDAP  12/06/2025  . INFLUENZA VACCINE  Completed  . Hepatitis C Screening  Completed  . PNA vac Low Risk Adult  Completed   Cancer Screenings: Lung: Low Dose CT Chest recommended if Age 60-80 years, 30 pack-year currently smoking OR have quit w/in 15years. Patient does not qualify. Colorectal: overdue, has never had colon cancer  screening      Plan:     I have personally reviewed and noted the following in the patient's chart:   . Medical and social history . Use of alcohol, tobacco or illicit drugs  . Current medications and supplements . Functional ability and status . Nutritional status . Physical activity . List of other physicians . Hospitalizations, surgeries, and ER visits in previous 12 months . Vitals . Screenings to include cognitive, depression, and falls . Referrals and appointments  In addition, I have reviewed and discussed with patient certain preventive protocols, quality metrics, and best practice recommendations. A written personalized care plan for preventive services as well as general preventive health recommendations were provided to patient.    Encounter for Medicare annual wellness exam - Plan: CBC, COMPLETE METABOLIC PANEL WITH GFR, Lipid Panel w/reflex Direct LDL  Vitamin D deficiency - Plan: Ergocalciferol 2000 units CAPS  Dyslipidemia associated with type 2 diabetes  mellitus (HCC) - Plan: Lipid Panel w/reflex Direct LDL  HIV disease (HCC) - Plan: CBC, COMPLETE METABOLIC PANEL WITH GFR  Hypertension associated with diabetes (HCC) - Plan: COMPLETE METABOLIC PANEL WITH GFR, valsartan (DIOVAN) 320 MG tablet  Encounter for monitoring statin therapy - Plan: COMPLETE METABOLIC PANEL WITH GFR, Lipid Panel w/reflex Direct LDL  Onychomycosis of multiple toenails with type 2 diabetes mellitus (HCC) - Plan: Ambulatory referral to Podiatry  Class 3 severe obesity due to excess calories with serious comorbidity and body mass index (BMI) of 40.0 to 44.9 in adult [facility](HCC)  Colon cancer screening - declines colonscopy, FOBT cards given in office 04/04/17  Elevated blood pressure reading in office with diagnosis of hypertension - increased valsartan to 320 mg 04/04/17  Comprehensive diabetic foot examination, type 2 DM, encounter for [facility](HCC) - Plan: Ambulatory referral to Podiatry  Follow-up in 2 weeks for nurse BP check  Carlis StableCharley Elizabeth Cummings, PA-C  04/04/2017

## 2017-04-07 LAB — CBC
HCT: 41.6 % (ref 38.5–50.0)
Hemoglobin: 14.5 g/dL (ref 13.2–17.1)
MCH: 30.8 pg (ref 27.0–33.0)
MCHC: 34.9 g/dL (ref 32.0–36.0)
MCV: 88.3 fL (ref 80.0–100.0)
MPV: 10.9 fL (ref 7.5–12.5)
PLATELETS: 200 10*3/uL (ref 140–400)
RBC: 4.71 10*6/uL (ref 4.20–5.80)
RDW: 13 % (ref 11.0–15.0)
WBC: 8.3 10*3/uL (ref 3.8–10.8)

## 2017-04-07 LAB — COMPLETE METABOLIC PANEL WITH GFR
AG Ratio: 1.3 (calc) (ref 1.0–2.5)
ALBUMIN MSPROF: 4.5 g/dL (ref 3.6–5.1)
ALKALINE PHOSPHATASE (APISO): 68 U/L (ref 40–115)
ALT: 20 U/L (ref 9–46)
AST: 15 U/L (ref 10–35)
BILIRUBIN TOTAL: 0.5 mg/dL (ref 0.2–1.2)
BUN: 21 mg/dL (ref 7–25)
CHLORIDE: 101 mmol/L (ref 98–110)
CO2: 26 mmol/L (ref 20–32)
CREATININE: 0.91 mg/dL (ref 0.70–1.25)
Calcium: 10 mg/dL (ref 8.6–10.3)
GFR, Est African American: 101 mL/min/{1.73_m2} (ref >=60)
GFR, Est Non African American: 87 mL/min/{1.73_m2} (ref >=60)
GLUCOSE: 160 mg/dL — AB (ref 65–99)
Globulin: 3.6 g/dL (calc) (ref 1.9–3.7)
Potassium: 4.5 mmol/L (ref 3.5–5.3)
Sodium: 137 mmol/L (ref 135–146)
Total Protein: 8.1 g/dL (ref 6.1–8.1)

## 2017-04-07 LAB — TEST AUTHORIZATION

## 2017-04-07 LAB — HEMOGLOBIN A1C W/OUT EAG: Hgb A1c MFr Bld: 6.7 % of total Hgb — ABNORMAL HIGH (ref <5.7)

## 2017-04-07 LAB — LIPID PANEL W/REFLEX DIRECT LDL
CHOLESTEROL: 234 mg/dL — AB (ref <200)
HDL: 46 mg/dL (ref >40)
LDL CHOLESTEROL (CALC): 159 mg/dL — AB
Non-HDL Cholesterol (Calc): 188 mg/dL (calc) — ABNORMAL HIGH (ref <130)
Total CHOL/HDL Ratio: 5.1 (calc) — ABNORMAL HIGH (ref <5.0)
Triglycerides: 154 mg/dL — ABNORMAL HIGH (ref <150)

## 2017-04-09 ENCOUNTER — Encounter: Payer: Self-pay | Admitting: Physician Assistant

## 2017-04-09 ENCOUNTER — Ambulatory Visit (INDEPENDENT_AMBULATORY_CARE_PROVIDER_SITE_OTHER): Payer: BLUE CROSS/BLUE SHIELD | Admitting: Physician Assistant

## 2017-04-09 VITALS — BP 162/75 | HR 84 | Wt 293.0 lb

## 2017-04-09 DIAGNOSIS — I10 Essential (primary) hypertension: Secondary | ICD-10-CM | POA: Diagnosis not present

## 2017-04-09 DIAGNOSIS — E1159 Type 2 diabetes mellitus with other circulatory complications: Secondary | ICD-10-CM

## 2017-04-09 DIAGNOSIS — I152 Hypertension secondary to endocrine disorders: Secondary | ICD-10-CM

## 2017-04-09 DIAGNOSIS — M169 Osteoarthritis of hip, unspecified: Secondary | ICD-10-CM

## 2017-04-09 HISTORY — DX: Osteoarthritis of hip, unspecified: M16.9

## 2017-04-09 NOTE — Patient Instructions (Addendum)
For your blood pressure: - Goal <130/80 - Take full-dose Valsartan 320 mg - baby aspirin 81 mg daily to help prevent heart attack/stroke - monitor and log blood pressures at home - check around the same time each day in a relaxed setting - Limit salt to <2000 mg/day - Follow DASH eating plan - limit alcohol to 2 standard drinks per day for men and 1 per day for women - avoid tobacco products - weight loss: 7% of current body weight - follow-up every 6 months for your blood pressure

## 2017-04-09 NOTE — Progress Notes (Signed)
HPI:                                                                Clifford Strong is a 67 y.o. male who presents to [facility] today for hypertension follow-up  HTN: He has only been taking his increased Valsartan 320 mg dose for 2 days. Compliant with medications. Does not check BP's at home. Reports "I feel great." States he had a cheat day yesterday and ate a lot of pizza/soda. Denies vision change, headache, chest pain with exertion, orthopnea, lightheadedness, syncope and edema. Risk factors include:  He is trying to lose weight. He is being followed by Dr. Cathey Endow, Bariatrics. He was started on Saxenda 01/29/18. He initially lost 10 pounds in the first week, but no longer feels it is curbing his appetite. He has been on the 3 mg dose for about 4 weeks. He is exercising 90-120 minutes per day on the stationary bike.  Depression screen [facility] 2/9 04/04/2017 03/12/2017  Decreased Interest 0 0  Down, Depressed, Hopeless 0 0  PHQ - 2 Score 0 0    No flowsheet data found.    Past Medical History:  Diagnosis Date  . Aortic stenosis   . Degenerative joint disease (DJD) of hip 04/09/2017  . Diabetes mellitus without complication (HCC)   . Heart murmur   . HIV infection (HCC)   . Hypertension   . Obesity    Past Surgical History:  Procedure Laterality Date  . AORTIC VALVE REPLACEMENT  2014   Social History   Tobacco Use  . Smoking status: Never Smoker  . Smokeless tobacco: Never Used  Substance Use Topics  . Alcohol use: No    Frequency: Never   family history includes Diabetes in his father and mother; Heart attack in his mother; Hypertension in his father and mother; Lung disease in his father.    ROS: negative except as noted in the HPI  Medications: Current Outpatient Medications  Medication Sig Dispense Refill  . aspirin EC 81 MG tablet Take by mouth.    Marland Kitchen atorvastatin (LIPITOR) 10 MG tablet Take 1 tablet (10 mg total) by  mouth at bedtime. 90 tablet 1  . carvedilol (COREG) 12.5 MG tablet Take 1 tablet (12.5 mg total) by mouth 2 (two) times daily with a meal. 60 tablet 3  . elvitegravir-cobicistat-emtricitabine-tenofovir (GENVOYA) 150-150-200-10 MG TABS tablet Take by mouth.    . Ergocalciferol 2000 units CAPS Take 1 capsule by mouth daily.    Marland Kitchen SAXENDA 18 MG/3ML SOPN INJECT 3MG  DAILY  1  . SitaGLIPtin-MetFORMIN HCl (JANUMET XR) 50-1000 MG TB24 TAKE 1TABLET DAILY WITH DINNER.    Marland Kitchen sulfamethoxazole-trimethoprim (BACTRIM DS,SEPTRA DS) 800-160 MG tablet Take 1 tablet by mouth daily.    . valsartan (DIOVAN) 320 MG tablet Take 1 tablet (320 mg total) by mouth daily. 90 tablet 1   No current facility-administered medications for this visit.    Allergies  Allergen Reactions  . Lisinopril Cough       Objective:  BP (!) 158/76   Pulse 84   Wt 293 lb (132.9 kg)   BMI 43.27 kg/m  Gen:  alert, not ill-appearing, no distress, appropriate for age, obese male HEENT: head normocephalic without obvious abnormality,  conjunctiva and cornea clear, trachea midline Pulm: Normal work of breathing, normal phonation, clear to auscultation bilaterally, no wheezes, rales or rhonchi CV: Normal rate, regular rhythm, s1 and s2 distinct, aortic holosystolic murmur; radial pulses 2+ symmetric Neuro: alert and oriented x 3, no tremor MSK: extremities atraumatic, normal gait and station, trace pitting edema Skin: intact, no rashes on exposed skin, no jaundice, no cyanosis Psych: well-groomed, cooperative, good eye contact, euthymic mood, affect mood-congruent, speech is articulate, and thought processes clear and goal-directed    No results found for this or any previous visit (from the past 72 hour(s)). No results found.    Assessment and Plan: 67 y.o. male with   1. Hypertension associated with diabetes (HCC) BP Readings from Last 3 Encounters:  04/09/17 (!) 162/75  04/04/17 (!) 157/78  03/12/17 (!) 135/91  - BP not  at goal due to medication noncompliance. He will continue Valsartan 320 mg and follow-up in 2 weeks - counseled on therapeutic lifestyle changes - ADA/DASH diet - encouraged to give Saxenda at least an additional month. He is not a candidate for other weight loss medications due to his valvular heart disease   Patient education and anticipatory guidance given Patient agrees with treatment plan Follow-up as needed if symptoms worsen or fail to improve  Levonne Hubertharley E. Frank Novelo PA-C

## 2017-04-16 ENCOUNTER — Ambulatory Visit (INDEPENDENT_AMBULATORY_CARE_PROVIDER_SITE_OTHER): Payer: BLUE CROSS/BLUE SHIELD | Admitting: Physician Assistant

## 2017-04-16 VITALS — BP 179/89 | HR 63 | Resp 16

## 2017-04-16 DIAGNOSIS — I10 Essential (primary) hypertension: Secondary | ICD-10-CM | POA: Diagnosis not present

## 2017-04-16 DIAGNOSIS — Z9189 Other specified personal risk factors, not elsewhere classified: Secondary | ICD-10-CM | POA: Diagnosis not present

## 2017-04-16 NOTE — Progress Notes (Signed)
   Subjective:    Patient ID: Clifford Strong, male    DOB: Jan 07, 1951, 67 y.o.   MRN: [id number]  HPI Patient is here for BP check; he got off night shift 2 hours ago; has had 3 cups of strong coffee.    Review of Systems     Objective:   Physical Exam  Vitals:   04/16/17 1102  BP: (!) 179/89  Pulse: 63  Resp: 16  SpO2: 99%         Assessment & Plan:  Will return tomorrow for BP check after being off work the night before and without drinking any caffeine.

## 2017-04-17 ENCOUNTER — Ambulatory Visit (INDEPENDENT_AMBULATORY_CARE_PROVIDER_SITE_OTHER): Payer: BLUE CROSS/BLUE SHIELD | Admitting: Sports Medicine

## 2017-04-17 VITALS — BP 126/69 | HR 71 | Wt 291.0 lb

## 2017-04-17 DIAGNOSIS — I10 Essential (primary) hypertension: Secondary | ICD-10-CM

## 2017-04-17 NOTE — Progress Notes (Signed)
   Subjective:    Patient ID: Clifford Strong, male    DOB: 1950/08/29, 67 y.o.   MRN: [id number]  HPI  Clifford Strong is here for blood pressure check. He is taking the Diovan as directed. Denies chest pain, shortness of breath, headaches or dizziness. No medication problems.     Review of Systems     Objective:   Physical Exam        Assessment & Plan:  Hypertension - Second check of blood pressure was within normal limits. Patient advised to continue taking current medications as directed. Follow up with Gena Frayharley Cummings, PA-C in 6 months as directed.

## 2017-04-17 NOTE — Patient Instructions (Addendum)
Continue taking medications as prescribed. Follow up with Clifford Frayharley Cummings, PA-C in 6 months as directed   DASH Eating Plan DASH stands for "Dietary Approaches to Stop Hypertension." The DASH eating plan is a healthy eating plan that has been shown to reduce high blood pressure (hypertension). It may also reduce your risk for type 2 diabetes, heart disease, and stroke. The DASH eating plan may also help with weight loss. What are tips for following this plan? General guidelines  Avoid eating more than 2,300 mg (milligrams) of salt (sodium) a day. If you have hypertension, you may need to reduce your sodium intake to 1,500 mg a day.  Limit alcohol intake to no more than 1 drink a day for nonpregnant women and 2 drinks a day for men. One drink equals 12 oz of beer, 5 oz of wine, or 1 oz of hard liquor.  Work with your health care provider to maintain a healthy body weight or to lose weight. Ask what an ideal weight is for you.  Get at least 30 minutes of exercise that causes your heart to beat faster (aerobic exercise) most days of the week. Activities may include walking, swimming, or biking.  Work with your health care provider or diet and nutrition specialist (dietitian) to adjust your eating plan to your individual calorie needs. Reading food labels  Check food labels for the amount of sodium per serving. Choose foods with less than 5 percent of the Daily Value of sodium. Generally, foods with less than 300 mg of sodium per serving fit into this eating plan.  To find whole grains, look for the word "whole" as the first word in the ingredient list. Shopping  Buy products labeled as "low-sodium" or "no salt added."  Buy fresh foods. Avoid canned foods and premade or frozen meals. Cooking  Avoid adding salt when cooking. Use salt-free seasonings or herbs instead of table salt or sea salt. Check with your health care provider or pharmacist before using salt substitutes.  Do not fry  foods. Cook foods using healthy methods such as baking, boiling, grilling, and broiling instead.  Cook with heart-healthy oils, such as olive, canola, soybean, or sunflower oil. Meal planning   Eat a balanced diet that includes: ? 5 or more servings of fruits and vegetables each day. At each meal, try to fill half of your plate with fruits and vegetables. ? Up to 6-8 servings of whole grains each day. ? Less than 6 oz of lean meat, poultry, or fish each day. A 3-oz serving of meat is about the same size as a deck of cards. One egg equals 1 oz. ? 2 servings of low-fat dairy each day. ? A serving of nuts, seeds, or beans 5 times each week. ? Heart-healthy fats. Healthy fats called Omega-3 fatty acids are found in foods such as flaxseeds and coldwater fish, like sardines, salmon, and mackerel.  Limit how much you eat of the following: ? Canned or prepackaged foods. ? Food that is high in trans fat, such as fried foods. ? Food that is high in saturated fat, such as fatty meat. ? Sweets, desserts, sugary drinks, and other foods with added sugar. ? Full-fat dairy products.  Do not salt foods before eating.  Try to eat at least 2 vegetarian meals each week.  Eat more home-cooked food and less restaurant, buffet, and fast food.  When eating at a restaurant, ask that your food be prepared with less salt or no salt, if possible. What  foods are recommended? The items listed may not be a complete list. Talk with your dietitian about what dietary choices are best for you. Grains Whole-grain or whole-wheat bread. Whole-grain or whole-wheat pasta. Brown rice. [place]ena Morrow. Bulgur. Whole-grain and low-sodium cereals. Pita bread. Low-fat, low-sodium crackers. Whole-wheat flour tortillas. Vegetables Fresh or frozen vegetables (raw, steamed, roasted, or grilled). Low-sodium or reduced-sodium tomato and vegetable juice. Low-sodium or reduced-sodium tomato sauce and tomato paste. Low-sodium or  reduced-sodium canned vegetables. Fruits All fresh, dried, or frozen fruit. Canned fruit in natural juice (without added sugar). Meat and other protein foods Skinless chicken or [place]. Ground chicken or [place]. Pork with fat trimmed off. Fish and seafood. Egg whites. Dried beans, peas, or lentils. Unsalted nuts, nut butters, and seeds. Unsalted canned beans. Lean cuts of beef with fat trimmed off. Low-sodium, lean deli meat. Dairy Low-fat (1%) or fat-free (skim) milk. Fat-free, low-fat, or reduced-fat cheeses. Nonfat, low-sodium ricotta or cottage cheese. Low-fat or nonfat yogurt. Low-fat, low-sodium cheese. Fats and oils Soft margarine without trans fats. Vegetable oil. Low-fat, reduced-fat, or light mayonnaise and salad dressings (reduced-sodium). Canola, safflower, olive, soybean, and sunflower oils. Avocado. Seasoning and other foods Herbs. Spices. Seasoning mixes without salt. Unsalted popcorn and pretzels. Fat-free sweets. What foods are not recommended? The items listed may not be a complete list. Talk with your dietitian about what dietary choices are best for you. Grains Baked goods made with fat, such as croissants, muffins, or some breads. Dry pasta or rice meal packs. Vegetables Creamed or fried vegetables. Vegetables in a cheese sauce. Regular canned vegetables (not low-sodium or reduced-sodium). Regular canned tomato sauce and paste (not low-sodium or reduced-sodium). Regular tomato and vegetable juice (not low-sodium or reduced-sodium). Angie Fava. Olives. Fruits Canned fruit in a light or heavy syrup. Fried fruit. Fruit in cream or butter sauce. Meat and other protein foods Fatty cuts of meat. Ribs. Fried meat. Berniece Salines. Sausage. Bologna and other processed lunch meats. Salami. Fatback. Hotdogs. Bratwurst. Salted nuts and seeds. Canned beans with added salt. Canned or smoked fish. Whole eggs or egg yolks. Chicken or [place] with skin. Dairy Whole or 2% milk, cream, and half-and-half.  Whole or full-fat cream cheese. Whole-fat or sweetened yogurt. Full-fat cheese. Nondairy creamers. Whipped toppings. Processed cheese and cheese spreads. Fats and oils Butter. Stick margarine. Lard. Shortening. Ghee. Bacon fat. Tropical oils, such as coconut, palm kernel, or palm oil. Seasoning and other foods Salted popcorn and pretzels. Onion salt, garlic salt, seasoned salt, table salt, and sea salt. Worcestershire sauce. Tartar sauce. Barbecue sauce. Teriyaki sauce. Soy sauce, including reduced-sodium. Steak sauce. Canned and packaged gravies. Fish sauce. Oyster sauce. Cocktail sauce. Horseradish that you find on the shelf. Ketchup. Mustard. Meat flavorings and tenderizers. Bouillon cubes. Hot sauce and Tabasco sauce. Premade or packaged marinades. Premade or packaged taco seasonings. Relishes. Regular salad dressings. Where to find more information:  National Heart, Lung, and [place]: https://wilson-eaton.com/  American Heart Association: www.heart.org Summary  The DASH eating plan is a healthy eating plan that has been shown to reduce high blood pressure (hypertension). It may also reduce your risk for type 2 diabetes, heart disease, and stroke.  With the DASH eating plan, you should limit salt (sodium) intake to 2,300 mg a day. If you have hypertension, you may need to reduce your sodium intake to 1,500 mg a day.  When on the DASH eating plan, aim to eat more fresh fruits and vegetables, whole grains, lean proteins, low-fat dairy, and heart-healthy fats.  Work with  your health care provider or diet and nutrition specialist (dietitian) to adjust your eating plan to your individual calorie needs. This information is not intended to replace advice given to you by your health care provider. Make sure you discuss any questions you have with your health care provider. Document Released: 02/09/2011 Document Revised: 02/14/2016 Document Reviewed: 02/14/2016 Elsevier Interactive Patient Education   [employer].

## 2017-04-30 DIAGNOSIS — E118 Type 2 diabetes mellitus with unspecified complications: Secondary | ICD-10-CM | POA: Diagnosis not present

## 2017-04-30 DIAGNOSIS — Z23 Encounter for immunization: Secondary | ICD-10-CM | POA: Diagnosis not present

## 2017-04-30 DIAGNOSIS — R894 Abnormal immunological findings in specimens from other organs, systems and tissues: Secondary | ICD-10-CM | POA: Diagnosis not present

## 2017-04-30 DIAGNOSIS — I10 Essential (primary) hypertension: Secondary | ICD-10-CM | POA: Diagnosis not present

## 2017-04-30 DIAGNOSIS — B20 Human immunodeficiency virus [HIV] disease: Secondary | ICD-10-CM | POA: Diagnosis not present

## 2017-05-14 ENCOUNTER — Encounter: Payer: Self-pay | Admitting: Physician Assistant

## 2017-05-14 MED ORDER — SITAGLIP PHOS-METFORMIN HCL ER 50-1000 MG PO TB24: 1.0000 | ORAL_TABLET | Freq: Every day | ORAL | 0 refills | Status: DC

## 2017-06-14 ENCOUNTER — Other Ambulatory Visit: Payer: Self-pay

## 2017-06-14 DIAGNOSIS — E1159 Type 2 diabetes mellitus with other circulatory complications: Secondary | ICD-10-CM

## 2017-06-14 DIAGNOSIS — I152 Hypertension secondary to endocrine disorders: Secondary | ICD-10-CM

## 2017-06-14 DIAGNOSIS — I10 Essential (primary) hypertension: Principal | ICD-10-CM

## 2017-06-14 MED ORDER — VALSARTAN 320 MG PO TABS: 320.0000 mg | ORAL_TABLET | Freq: Every day | ORAL | 0 refills | Status: DC

## 2017-06-25 ENCOUNTER — Other Ambulatory Visit: Payer: Self-pay

## 2017-06-25 DIAGNOSIS — I10 Essential (primary) hypertension: Secondary | ICD-10-CM

## 2017-06-25 DIAGNOSIS — E785 Hyperlipidemia, unspecified: Principal | ICD-10-CM

## 2017-06-25 DIAGNOSIS — Z9889 Other specified postprocedural states: Secondary | ICD-10-CM

## 2017-06-25 DIAGNOSIS — E1169 Type 2 diabetes mellitus with other specified complication: Secondary | ICD-10-CM

## 2017-06-25 MED ORDER — CARVEDILOL 12.5 MG PO TABS
12.5000 mg | ORAL_TABLET | Freq: Two times a day (BID) | ORAL | 1 refills | Status: DC
Start: 2017-06-25 — End: 2017-11-02

## 2017-06-25 MED ORDER — ATORVASTATIN CALCIUM 10 MG PO TABS: 10.0000 mg | ORAL_TABLET | Freq: Every day | ORAL | 1 refills | Status: DC

## 2017-06-25 NOTE — Telephone Encounter (Signed)
Jaci StandardAlec, RPh from [facility], called requesting RF for Lipitor, Carvedilol, and Janumet be sent or pt.   Janumet RX sent to pharmacy for 90 day supply on 05-14-17.   Will Send RX for other 2 medications to mail order.

## 2017-07-02 ENCOUNTER — Other Ambulatory Visit: Payer: Self-pay

## 2017-07-02 MED ORDER — SITAGLIP PHOS-METFORMIN HCL ER 50-1000 MG PO TB24: 1.0000 | ORAL_TABLET | Freq: Every day | ORAL | 0 refills | Status: DC

## 2017-07-03 ENCOUNTER — Encounter: Payer: Self-pay | Admitting: Physician Assistant

## 2017-07-05 LAB — HM DIABETES EYE EXAM

## 2017-09-03 ENCOUNTER — Other Ambulatory Visit: Payer: Self-pay | Admitting: Physician Assistant

## 2017-09-03 DIAGNOSIS — E785 Hyperlipidemia, unspecified: Principal | ICD-10-CM

## 2017-09-03 DIAGNOSIS — E1169 Type 2 diabetes mellitus with other specified complication: Secondary | ICD-10-CM

## 2017-09-05 ENCOUNTER — Other Ambulatory Visit: Payer: Self-pay | Admitting: Physician Assistant

## 2017-10-15 ENCOUNTER — Ambulatory Visit: Payer: BLUE CROSS/BLUE SHIELD | Admitting: Physician Assistant

## 2017-10-22 DIAGNOSIS — Z952 Presence of prosthetic heart valve: Secondary | ICD-10-CM | POA: Diagnosis not present

## 2017-10-22 DIAGNOSIS — B20 Human immunodeficiency virus [HIV] disease: Secondary | ICD-10-CM | POA: Diagnosis not present

## 2017-10-22 DIAGNOSIS — I10 Essential (primary) hypertension: Secondary | ICD-10-CM | POA: Diagnosis not present

## 2017-10-22 DIAGNOSIS — Z23 Encounter for immunization: Secondary | ICD-10-CM | POA: Diagnosis not present

## 2017-10-22 DIAGNOSIS — E118 Type 2 diabetes mellitus with unspecified complications: Secondary | ICD-10-CM | POA: Diagnosis not present

## 2017-11-02 ENCOUNTER — Ambulatory Visit (INDEPENDENT_AMBULATORY_CARE_PROVIDER_SITE_OTHER): Payer: BLUE CROSS/BLUE SHIELD | Admitting: Physician Assistant

## 2017-11-02 ENCOUNTER — Encounter: Payer: Self-pay | Admitting: Physician Assistant

## 2017-11-02 ENCOUNTER — Ambulatory Visit (INDEPENDENT_AMBULATORY_CARE_PROVIDER_SITE_OTHER): Payer: BLUE CROSS/BLUE SHIELD

## 2017-11-02 VITALS — BP 168/98 | HR 76 | Wt 304.0 lb

## 2017-11-02 DIAGNOSIS — B20 Human immunodeficiency virus [HIV] disease: Secondary | ICD-10-CM | POA: Diagnosis not present

## 2017-11-02 DIAGNOSIS — R918 Other nonspecific abnormal finding of lung field: Secondary | ICD-10-CM

## 2017-11-02 DIAGNOSIS — I10 Essential (primary) hypertension: Secondary | ICD-10-CM

## 2017-11-02 DIAGNOSIS — E1165 Type 2 diabetes mellitus with hyperglycemia: Secondary | ICD-10-CM

## 2017-11-02 DIAGNOSIS — R600 Localized edema: Secondary | ICD-10-CM

## 2017-11-02 DIAGNOSIS — R911 Solitary pulmonary nodule: Secondary | ICD-10-CM

## 2017-11-02 DIAGNOSIS — E66813 Obesity, class 3: Secondary | ICD-10-CM

## 2017-11-02 DIAGNOSIS — J984 Other disorders of lung: Secondary | ICD-10-CM | POA: Diagnosis not present

## 2017-11-02 DIAGNOSIS — E1129 Type 2 diabetes mellitus with other diabetic kidney complication: Secondary | ICD-10-CM

## 2017-11-02 DIAGNOSIS — Z5181 Encounter for therapeutic drug level monitoring: Secondary | ICD-10-CM

## 2017-11-02 DIAGNOSIS — R062 Wheezing: Secondary | ICD-10-CM

## 2017-11-02 DIAGNOSIS — R0609 Other forms of dyspnea: Secondary | ICD-10-CM

## 2017-11-02 DIAGNOSIS — E1159 Type 2 diabetes mellitus with other circulatory complications: Secondary | ICD-10-CM | POA: Diagnosis not present

## 2017-11-02 DIAGNOSIS — R809 Proteinuria, unspecified: Secondary | ICD-10-CM

## 2017-11-02 DIAGNOSIS — R609 Edema, unspecified: Secondary | ICD-10-CM | POA: Diagnosis not present

## 2017-11-02 DIAGNOSIS — Z79899 Other long term (current) drug therapy: Secondary | ICD-10-CM

## 2017-11-02 DIAGNOSIS — Z9889 Other specified postprocedural states: Secondary | ICD-10-CM

## 2017-11-02 DIAGNOSIS — I152 Hypertension secondary to endocrine disorders: Secondary | ICD-10-CM

## 2017-11-02 DIAGNOSIS — Z6841 Body Mass Index (BMI) 40.0 and over, adult: Secondary | ICD-10-CM

## 2017-11-02 DIAGNOSIS — R0600 Dyspnea, unspecified: Secondary | ICD-10-CM | POA: Insufficient documentation

## 2017-11-02 LAB — POCT GLYCOSYLATED HEMOGLOBIN (HGB A1C): HBA1C, POC (CONTROLLED DIABETIC RANGE): 9.2 % — AB (ref 0.0–7.0)

## 2017-11-02 MED ORDER — FUROSEMIDE 20 MG PO TABS: 20.0000 mg | ORAL_TABLET | Freq: Every day | ORAL | 0 refills | Status: DC

## 2017-11-02 MED ORDER — CARVEDILOL 12.5 MG PO TABS: 12.5000 mg | ORAL_TABLET | Freq: Two times a day (BID) | ORAL | 1 refills | Status: DC

## 2017-11-02 MED ORDER — VALSARTAN-HYDROCHLOROTHIAZIDE 320-25 MG PO TABS: 1.0000 | ORAL_TABLET | Freq: Every day | ORAL | 1 refills | Status: DC

## 2017-11-02 MED ORDER — SITAGLIP PHOS-METFORMIN HCL ER 50-1000 MG PO TB24: 2.0000 | ORAL_TABLET | Freq: Every day | ORAL | 1 refills | Status: DC

## 2017-11-02 MED ORDER — SAXENDA 18 MG/3ML ~~LOC~~ SOPN: 3.0000 mg | PEN_INJECTOR | Freq: Every day | SUBCUTANEOUS | 1 refills | Status: DC

## 2017-11-02 NOTE — Patient Instructions (Addendum)
Chest x-ray and labs today You will be contacted to schedule an echocardiogram Start Lasix 20 mg daily for 1 week Start new Valsartan-HCTZ prescription once you receive it from mail order Follow-up next Thursday or Friday    Shortness of Breath, Adult Shortness of breath is when a person has trouble breathing enough air, or when a person feels like she or he is having trouble breathing in enough air. Shortness of breath could be a sign of medical problem. Follow these instructions at home: Pay attention to any changes in your symptoms. Take these actions to help with your condition:  Do not smoke. Smoking is a common cause of shortness of breath. If you smoke and you need help quitting, ask your health care provider.  Avoid things that can irritate your airways, such as: ? Mold. ? Dust. ? Air pollution. ? Chemical fumes. ? Things that can cause allergy symptoms (allergens), if you have allergies.  Keep your living space clean and free of mold and dust.  Rest as needed. Slowly return to your usual activities.  Take over-the-counter and prescription medicines, including oxygen and inhaled medicines, only as told by your health care provider.  Keep all follow-up visits as told by your health care provider. This is important.  Contact a health care provider if:  Your condition does not improve as soon as expected.  You have a hard time doing your normal activities, even after you rest.  You have new symptoms. Get help right away if:  Your shortness of breath gets worse.  You have shortness of breath when you are resting.  You feel light-headed or you faint.  You have a cough that is not controlled with medicines.  You cough up blood.  You have pain with breathing.  You have pain in your chest, arms, shoulders, or abdomen.  You have a fever.  You cannot walk up stairs or exercise the way that you normally do. This information is not intended to replace advice given  to you by your health care provider. Make sure you discuss any questions you have with your health care provider. Document Released: 11/15/2000 Document Revised: 09/11/2015 Document Reviewed: 07/29/2015 Elsevier Interactive Patient Education  [employer]2018 [employer]

## 2017-11-02 NOTE — Progress Notes (Signed)
HPI:                                                                Clifford Strong is a 67 y.o. male who [facility] today for medication management  DMII: self-discontinued his Saxenda because he did not feel it was helping with weight loss. Currently taking Januvia only. Denies polydipsia, polyuria, polyphagia. Denies blurred vision or vision change. Denies extremity pain, altered sensation and paresthesias.  Denies ulcers/wounds on feet.  Has experienced 15 pound weight gain and has noticed some dyspnea on exertion that he did not have before. Endorses some intermittent wheezing. Denies cough, chest pain, orthopnea, PND.  HTN: taking Valsartan 320 daily and Carvedilol 12.5 mg bid. Compliant with medications. Does not check BP's at home. Denies vision change, headache, chest pain with exertion, orthopnea, lightheadedness, syncope and edema. Risk factors include: DM2, obesity, male sex, age>55   Depression screen [facility] 2/9 04/04/2017 03/12/2017  Decreased Interest 0 0  Down, Depressed, Hopeless 0 0  PHQ - 2 Score 0 0    No flowsheet data found.    Past Medical History:  Diagnosis Date  . Aortic stenosis   . Degenerative joint disease (DJD) of hip 04/09/2017  . Diabetes mellitus without complication (HCC)   . Heart murmur   . HIV infection (HCC)   . Hypertension   . Obesity    Past Surgical History:  Procedure Laterality Date  . AORTIC VALVE REPLACEMENT  2014   Social History   Tobacco Use  . Smoking status: Never Smoker  . Smokeless tobacco: Never Used  Substance Use Topics  . Alcohol use: No    Frequency: Never   family history includes Diabetes in his father and mother; Heart attack in his mother; Hypertension in his father and mother; Lung disease in his father.    ROS: negative except as noted in the HPI  Medications: Current Outpatient Medications  Medication Sig Dispense Refill  . aspirin EC 81 MG  tablet Take by mouth.    Marland Kitchen. atorvastatin (LIPITOR) 10 MG tablet Take 1 tablet (10 mg total) by mouth at bedtime. 90 tablet 1  . carvedilol (COREG) 12.5 MG tablet Take 1 tablet (12.5 mg total) by mouth 2 (two) times daily with a meal. 180 tablet 1  . elvitegravir-cobicistat-emtricitabine-tenofovir (GENVOYA) 150-150-200-10 MG TABS tablet Take by mouth.    . Ergocalciferol 2000 units CAPS Take 1 capsule by mouth daily.    Marland Kitchen. SAXENDA 18 MG/3ML SOPN Inject 3 mg as directed daily. 9 mL 1  . SitaGLIPtin-MetFORMIN HCl (JANUMET XR) 50-1000 MG TB24 Take 2 tablets by mouth daily with breakfast. 180 tablet 1  . furosemide (LASIX) 20 MG tablet Take 1 tablet (20 mg total) by mouth daily for 7 days. 7 tablet 0  . valsartan-hydrochlorothiazide (DIOVAN-HCT) 320-25 MG tablet Take 1 tablet by mouth daily. 90 tablet 1   No current facility-administered medications for this visit.    Allergies  Allergen Reactions  . Lisinopril Cough       Objective:  BP (!) 168/98   Pulse 76   Wt (!) 304 lb (137.9 kg)   SpO2 95%   BMI 44.89 kg/m  Gen:  alert, not ill-appearing, no distress, appropriate for  age, obese male HEENT: head normocephalic without obvious abnormality, conjunctiva and cornea clear, trachea midline, no JVD Pulm: Normal work of breathing, normal phonation, clear to auscultation bilaterally, no wheezes, rales or rhonchi CV: Normal rate, regular rhythm, s1 and s2 distinct, grade III/VI systolic murmur Neuro: alert and oriented x 3, no tremor MSK: extremities atraumatic, normal gait and station, 2+ peripheral edema bilaterally Skin: intact, no rashes on exposed skin, no jaundice, no cyanosis Psych: well-groomed, cooperative, good eye contact, euthymic mood, affect mood-congruent, speech is articulate, and thought processes clear and goal-directed    Results for orders placed or performed in visit on 11/02/17 (from the past 72 hour(s))  POCT HgB A1C     Status: Abnormal   Collection Time: 11/02/17   1:29 PM  Result Value Ref Range   Hemoglobin A1C     HbA1c POC (<> result, manual entry)     HbA1c, POC (prediabetic range)     HbA1c, POC (controlled diabetic range) 9.2 (A) 0.0 - 7.0 %   No results found.    Assessment and Plan: 67 y.o. male with   .Clifford Strong was seen today for hypertension.  Diagnoses and all orders for this visit:  Encounter for medication management  Hypertension associated with diabetes (HCC) -     carvedilol (COREG) 12.5 MG tablet; Take 1 tablet (12.5 mg total) by mouth 2 (two) times daily with a meal. -     Renal Function Panel -     valsartan-hydrochlorothiazide (DIOVAN-HCT) 320-25 MG tablet; Take 1 tablet by mouth daily.  Microalbuminuria due to type 2 diabetes mellitus (HCC) -     Renal Function Panel  Status post aortic valve repair Comments: Non-rheumatic, calcified aortic valve Orders: -     carvedilol (COREG) 12.5 MG tablet; Take 1 tablet (12.5 mg total) by mouth 2 (two) times daily with a meal.  Uncontrolled type 2 diabetes mellitus with hyperglycemia (HCC) -     POCT HgB A1C -     SitaGLIPtin-MetFORMIN HCl (JANUMET XR) 50-1000 MG TB24; Take 2 tablets by mouth daily with breakfast. -     Ambulatory referral to diabetic education  Class 3 severe obesity due to excess calories with serious comorbidity and body mass index (BMI) of 40.0 to 44.9 in adult (HCC) -     SAXENDA 18 MG/3ML SOPN; Inject 3 mg as directed daily. -     Ambulatory referral to diabetic education  Dyspnea on exertion -     DG Chest 2 View -     CBC with Differential/Platelet -     Renal Function Panel -     B Nat Peptide -     ECHOCARDIOGRAM COMPLETE; Future -     ECHOCARDIOGRAM COMPLETE  Peripheral edema -     ECHOCARDIOGRAM COMPLETE; Future -     furosemide (LASIX) 20 MG tablet; Take 1 tablet (20 mg total) by mouth daily for 7 days. -     ECHOCARDIOGRAM COMPLETE  Encounter for monitoring statin therapy -     Hepatic function panel  HIV disease (HCC) -      Hepatic function panel   Type 2 DM Lab Results  Component Value Date   HGBA1C 9.2 (A) 11/02/2017  - poorly controlled 2/2 medication noncompliance/weight gain - increasing Januvia to 2 tabs daily - re-starting Saxenda, start with 1.5 mg daily for 1-2 weeks, then increase to 3 mg daily - referring to diabetes educator - BP not at goal, adding HCTZ to Valsartan - cont statin,  LDL goal <70 - DM eye exam UTD, no retinopathy, sent to scan - declined influenza - pneumovax UTD  Dyspnea on Exertion - 2+ bilateral peripheral edema and history of valvular heart disease, no JVD, PND or orthopnea, no adventitious lung sounds. Possible obesity-hypoventilation syndrome, especially with weight gain and deconditioning. However, with his cardiac history and comorbidities, need to r/o cardiopulmonary cause. Echo to assess LVEF. CXR, BNP, renal function pending - Lasix 20 mg daily x 1 week   Patient education and anticipatory guidance given Patient agrees with treatment plan Follow-up in 1 week for dyspnea, 3 months for diabetes f/u or sooner as needed if symptoms worsen or fail to improve  Levonne Hubert PA-C

## 2017-11-03 LAB — HEPATIC FUNCTION PANEL
AG RATIO: 1.3 (calc) (ref 1.0–2.5)
ALKALINE PHOSPHATASE (APISO): 67 U/L (ref 40–115)
ALT: 28 U/L (ref 9–46)
AST: 19 U/L (ref 10–35)
Albumin: 4.3 g/dL (ref 3.6–5.1)
BILIRUBIN INDIRECT: 0.4 mg/dL (ref 0.2–1.2)
Bilirubin, Direct: 0.1 mg/dL (ref 0.0–0.2)
Globulin: 3.4 g/dL (calc) (ref 1.9–3.7)
TOTAL PROTEIN: 7.7 g/dL (ref 6.1–8.1)
Total Bilirubin: 0.5 mg/dL (ref 0.2–1.2)

## 2017-11-03 LAB — CBC WITH DIFFERENTIAL/PLATELET
Basophils Absolute: 92 cells/uL (ref 0–200)
Basophils Relative: 1.5 %
EOS PCT: 2.8 %
Eosinophils Absolute: 171 cells/uL (ref 15–500)
HEMATOCRIT: 40.7 % (ref 38.5–50.0)
HEMOGLOBIN: 14 g/dL (ref 13.2–17.1)
LYMPHS ABS: 1854 {cells}/uL (ref 850–3900)
MCH: 31.4 pg (ref 27.0–33.0)
MCHC: 34.4 g/dL (ref 32.0–36.0)
MCV: 91.3 fL (ref 80.0–100.0)
MONOS PCT: 8.2 %
MPV: 11 fL (ref 7.5–12.5)
NEUTROS ABS: 3483 {cells}/uL (ref 1500–7800)
NEUTROS PCT: 57.1 %
PLATELETS: 167 10*3/uL (ref 140–400)
RBC: 4.46 10*6/uL (ref 4.20–5.80)
RDW: 12.9 % (ref 11.0–15.0)
Total Lymphocyte: 30.4 %
WBC mixed population: 500 cells/uL (ref 200–950)
WBC: 6.1 10*3/uL (ref 3.8–10.8)

## 2017-11-03 LAB — RENAL FUNCTION PANEL
Albumin: 4.6 g/dL (ref 3.6–5.1)
BUN/Creatinine Ratio: 23 (calc) — ABNORMAL HIGH (ref 6–22)
BUN: 28 mg/dL — ABNORMAL HIGH (ref 7–25)
CHLORIDE: 102 mmol/L (ref 98–110)
CO2: 26 mmol/L (ref 20–32)
Calcium: 9.9 mg/dL (ref 8.6–10.3)
Creat: 1.2 mg/dL (ref 0.70–1.25)
Glucose, Bld: 240 mg/dL — ABNORMAL HIGH (ref 65–99)
PHOSPHORUS: 3.6 mg/dL (ref 2.1–4.3)
Potassium: 4.5 mmol/L (ref 3.5–5.3)
Sodium: 137 mmol/L (ref 135–146)

## 2017-11-03 LAB — BRAIN NATRIURETIC PEPTIDE: BRAIN NATRIURETIC PEPTIDE: 157 pg/mL — AB (ref <100)

## 2017-11-07 NOTE — Addendum Note (Signed)
Addended by: Gena Fray E on: 11/07/2017 08:15 AM   Modules accepted: Orders

## 2017-11-07 NOTE — Progress Notes (Signed)
Good morning Clifford Strong,  Your chest x-ray shows bronchitis. This is usually due to a virus and will improve on its own in about 2 weeks. However, if you are having fever, chills, productive cough or worsening breathing difficulties, we should see you for follow-up and adjust the treatment plan.  There is also a nodule in your right lung. A chest CT is recommended to better characterize the nodule. This can be done non-emergently whenever you are feeling better. You will be contacted by someone in the imaging department to schedule it.  Best, Vinetta Bergamo

## 2017-11-08 ENCOUNTER — Other Ambulatory Visit (HOSPITAL_BASED_OUTPATIENT_CLINIC_OR_DEPARTMENT_OTHER): Payer: BLUE CROSS/BLUE SHIELD

## 2017-11-08 ENCOUNTER — Ambulatory Visit (INDEPENDENT_AMBULATORY_CARE_PROVIDER_SITE_OTHER): Payer: BLUE CROSS/BLUE SHIELD | Admitting: Physician Assistant

## 2017-11-08 ENCOUNTER — Encounter: Payer: Self-pay | Admitting: Physician Assistant

## 2017-11-08 VITALS — BP 156/86 | HR 87 | Resp 20 | Wt 300.0 lb

## 2017-11-08 DIAGNOSIS — R609 Edema, unspecified: Secondary | ICD-10-CM | POA: Diagnosis not present

## 2017-11-08 DIAGNOSIS — R918 Other nonspecific abnormal finding of lung field: Secondary | ICD-10-CM

## 2017-11-08 DIAGNOSIS — R0609 Other forms of dyspnea: Secondary | ICD-10-CM | POA: Diagnosis not present

## 2017-11-08 DIAGNOSIS — R0600 Dyspnea, unspecified: Secondary | ICD-10-CM

## 2017-11-08 DIAGNOSIS — R911 Solitary pulmonary nodule: Secondary | ICD-10-CM | POA: Diagnosis not present

## 2017-11-08 MED ORDER — FUROSEMIDE 20 MG PO TABS: ORAL_TABLET | ORAL | 0 refills | Status: DC

## 2017-11-08 NOTE — Patient Instructions (Addendum)
Weigh yourself daily Take Lasix as needed if you gain 3 pounds or more and have swelling in your legs Keep appt for Echocardiogram You will be contacted to schedule your Chest CT    Shortness of Breath, Adult Shortness of breath is when a person has trouble breathing enough air, or when a person feels like she or he is having trouble breathing in enough air. Shortness of breath could be a sign of medical problem. Follow these instructions at home: Pay attention to any changes in your symptoms. Take these actions to help with your condition:  Do not smoke. Smoking is a common cause of shortness of breath. If you smoke and you need help quitting, ask your health care provider.  Avoid things that can irritate your airways, such as: ? Mold. ? Dust. ? Air pollution. ? Chemical fumes. ? Things that can cause allergy symptoms (allergens), if you have allergies.  Keep your living space clean and free of mold and dust.  Rest as needed. Slowly return to your usual activities.  Take over-the-counter and prescription medicines, including oxygen and inhaled medicines, only as told by your health care provider.  Keep all follow-up visits as told by your health care provider. This is important.  Contact a health care provider if:  Your condition does not improve as soon as expected.  You have a hard time doing your normal activities, even after you rest.  You have new symptoms. Get help right away if:  Your shortness of breath gets worse.  You have shortness of breath when you are resting.  You feel light-headed or you faint.  You have a cough that is not controlled with medicines.  You cough up blood.  You have pain with breathing.  You have pain in your chest, arms, shoulders, or abdomen.  You have a fever.  You cannot walk up stairs or exercise the way that you normally do. This information is not intended to replace advice given to you by your health care provider. Make  sure you discuss any questions you have with your health care provider. Document Released: 11/15/2000 Document Revised: 09/11/2015 Document Reviewed: 07/29/2015 Elsevier Interactive Patient Education  [employer].

## 2017-11-08 NOTE — Progress Notes (Signed)
HPI:                                                                Clifford Strong is a 67 y.o. male who presents to [facility]: [facility] today for dyspnea follow-up  Since last office visit on 11/02/17: - patient reports subjective improvement in his symptoms. He still has some shortness of breath, He attributes this to weight gain and has made dietary adjustments to lose weight - he has been taking Lasix 20 mg daily. Edema has improved - he has lost 4 pounds - denies fever, chills, cough, wheezing, or chest pain. Denies lightheadedness or syncope. Denies orthopnea, PND. Reports he is able to perform his job 8 hours/day standing for half of that shift without dyspnea.  Reports 18 years of exposure to acetone and MEK, AV8 at work. Never smoker.   Work-up CXR 11/02/17 diffuse peribronchial cuffing suggestive of acute bronchitis, 1 cm nodule in the right lung Labs 11/02/17 BNP slightly elevated 157, CBC unremarkable, normal renal function He has an Echocardiogram scheduled for 9/19  Depression screen [facility] 2/9 04/04/2017 03/12/2017  Decreased Interest 0 0  Down, Depressed, Hopeless 0 0  PHQ - 2 Score 0 0    No flowsheet data found.    Past Medical History:  Diagnosis Date  . Aortic stenosis   . Degenerative joint disease (DJD) of hip 04/09/2017  . Diabetes mellitus without complication (HCC)   . Heart murmur   . HIV infection (HCC)   . Hypertension   . Obesity    Past Surgical History:  Procedure Laterality Date  . AORTIC VALVE REPLACEMENT  2014   Social History   Tobacco Use  . Smoking status: Never Smoker  . Smokeless tobacco: Never Used  Substance Use Topics  . Alcohol use: No    Frequency: Never   family history includes Diabetes in his father and mother; Heart attack in his mother; Hypertension in his father and mother; Lung disease in his father.    ROS: negative except as noted in the HPI  Medications: Current Outpatient  Medications  Medication Sig Dispense Refill  . aspirin EC 81 MG tablet Take by mouth.    Marland Kitchen atorvastatin (LIPITOR) 10 MG tablet Take 1 tablet (10 mg total) by mouth at bedtime. 90 tablet 1  . carvedilol (COREG) 12.5 MG tablet Take 1 tablet (12.5 mg total) by mouth 2 (two) times daily with a meal. 180 tablet 1  . elvitegravir-cobicistat-emtricitabine-tenofovir (GENVOYA) 150-150-200-10 MG TABS tablet Take by mouth.    . Ergocalciferol 2000 units CAPS Take 1 capsule by mouth daily.    . furosemide (LASIX) 20 MG tablet 1 tab PO daily prn for weight gain of 3 pounds or more and edema 30 tablet 0  . SAXENDA 18 MG/3ML SOPN Inject 3 mg as directed daily. 9 mL 1  . SitaGLIPtin-MetFORMIN HCl (JANUMET XR) 50-1000 MG TB24 Take 2 tablets by mouth daily with breakfast. 180 tablet 1  . valsartan-hydrochlorothiazide (DIOVAN-HCT) 320-25 MG tablet Take 1 tablet by mouth daily. 90 tablet 1   No current facility-administered medications for this visit.    Allergies  Allergen Reactions  . Lisinopril Cough       Objective:  BP (!) 156/86  Pulse 87   Resp 20   Wt 300 lb (136.1 kg)   SpO2 96%   BMI 44.30 kg/m  Gen:  alert, not ill-appearing, no distress, appropriate for age, obese male HEENT: head normocephalic without obvious abnormality, conjunctiva and cornea clear, trachea midline, no JVD Pulm: Normal work of breathing, normal phonation, diminished expiratory phase, clear to auscultation bilaterally, no wheezes, rales or rhonchi CV: Normal rate, regular rhythm, s1 and s2 distinct, no murmurs, clicks or rubs  Neuro: alert and oriented x 3, no tremor MSK: extremities atraumatic, normal gait and station, 1+ peripheral edema bilaterally Skin: intact, no rashes on exposed skin, no jaundice, no cyanosis  CLINICAL DATA:  67 year old male with history of wheezing for 1 month.  EXAM: CHEST - 2 VIEW  COMPARISON:  No priors.  FINDINGS: Mild diffuse peribronchial cuffing. Lung volumes are normal.  No consolidative airspace disease. No pleural effusions. No pneumothorax. 1 cm nodule projecting in the right mid lung. Pulmonary vasculature and the cardiomediastinal silhouette are within normal limits. Atherosclerosis in the thoracic aorta. Status post median sternotomy for aortic valve replacement.  IMPRESSION: 1. Diffuse peribronchial cuffing, suggestive of an acute bronchitis. 2. 1 cm nodular density projecting in the mid right lung, either in the superior aspect of the right middle lobe or inferior aspect of the right upper lobe. Further evaluation with nonemergent chest CT is suggested in the near future to better evaluate this finding.   Electronically Signed   By: Trudie Reed M.D.   On: 11/03/2017 16:19  Lab Results  Component Value Date   CREATININE 1.20 11/02/2017   BUN 28 (H) 11/02/2017   NA 137 11/02/2017   K 4.5 11/02/2017   CL 102 11/02/2017   CO2 26 11/02/2017   Lab Results  Component Value Date   WBC 6.1 11/02/2017   HGB 14.0 11/02/2017   HCT 40.7 11/02/2017   MCV 91.3 11/02/2017   PLT 167 11/02/2017     No results found for this or any previous visit (from the past 72 hour(s)). No results found.    Assessment and Plan: 67 y.o. male with   .Diagnoses and all orders for this visit:  Dyspnea on exertion  Abnormal findings on diagnostic imaging of lung  Nodule of middle lobe of right lung  Peripheral edema -     furosemide (LASIX) 20 MG tablet; 1 tab PO daily prn for weight gain of 3 pounds or more and edema    - SpO2 96% on RA at rest today, no tachypnea, no adventitious lung sounds, slightly diminished expiratory phase - BNP slightly elevated, but not in a range to suggest heart failure. CXR negative for volume overload. No JVD. 1+ peripheral edema, improved from 2+ since starting Lasix - no fever, chills, cough, wheezing or leukocytosis to suggest acute bronchitis/infectious etiology - obesity-hypoventilation syndrome is a  possibility - given hx of aortic valve disease, I would like a recent echo to assess his EF. This is scheduled for later this month. - plan to continue low-dose Lasix prn. Patient instructed to weigh himself daily and take Lasix for weight gain >3 pounds associated with edema - CT chest to assess nodule is pending    Patient education and anticipatory guidance given Patient agrees with treatment plan Follow-up in 3 weeks or sooner as needed if symptoms worsen or fail to improve  Levonne Hubert PA-C

## 2017-11-09 ENCOUNTER — Encounter: Payer: Self-pay | Admitting: Physician Assistant

## 2017-11-11 ENCOUNTER — Other Ambulatory Visit: Payer: Self-pay | Admitting: Physician Assistant

## 2017-11-22 ENCOUNTER — Ambulatory Visit (HOSPITAL_BASED_OUTPATIENT_CLINIC_OR_DEPARTMENT_OTHER)
Admission: RE | Admit: 2017-11-22 | Discharge: 2017-11-22 | Disposition: A | Discharge status: Home / Self Care | Payer: BLUE CROSS/BLUE SHIELD | Source: Ambulatory Visit | Attending: Physician Assistant | Admitting: Physician Assistant

## 2017-11-22 DIAGNOSIS — R609 Edema, unspecified: Secondary | ICD-10-CM | POA: Diagnosis not present

## 2017-11-22 DIAGNOSIS — I359 Nonrheumatic aortic valve disorder, unspecified: Secondary | ICD-10-CM | POA: Diagnosis not present

## 2017-11-22 DIAGNOSIS — E785 Hyperlipidemia, unspecified: Secondary | ICD-10-CM | POA: Insufficient documentation

## 2017-11-22 DIAGNOSIS — E119 Type 2 diabetes mellitus without complications: Secondary | ICD-10-CM | POA: Diagnosis not present

## 2017-11-22 DIAGNOSIS — I313 Pericardial effusion (noninflammatory): Secondary | ICD-10-CM | POA: Insufficient documentation

## 2017-11-22 DIAGNOSIS — I10 Essential (primary) hypertension: Secondary | ICD-10-CM | POA: Diagnosis not present

## 2017-11-22 DIAGNOSIS — R0609 Other forms of dyspnea: Secondary | ICD-10-CM

## 2017-11-22 NOTE — Progress Notes (Signed)
  Echocardiogram 2D Echocardiogram has been performed.  Lanissa Cashen T Sherice Ijames 11/22/2017, 1:56 PM

## 2017-11-26 ENCOUNTER — Encounter: Payer: Self-pay | Admitting: Physician Assistant

## 2017-11-26 ENCOUNTER — Telehealth: Payer: Self-pay | Admitting: Physician Assistant

## 2017-11-26 DIAGNOSIS — I5189 Other ill-defined heart diseases: Secondary | ICD-10-CM | POA: Insufficient documentation

## 2017-11-26 DIAGNOSIS — Z9889 Other specified postprocedural states: Secondary | ICD-10-CM

## 2017-11-26 DIAGNOSIS — T8201XA Breakdown (mechanical) of heart valve prosthesis, initial encounter: Secondary | ICD-10-CM

## 2017-11-26 NOTE — Telephone Encounter (Signed)
Called and spoke to wife on Friday. She stated patient was sleeping and would return our call. Patient did not call back on Friday. Spoke to patient this morning and relayed results of echocardiogram showing failure of his previously repaired aortic valve. We will place new referrals to his Cardiologist and CT surgeon, Dr. Vilinda BoehringerGary Renaldo and Dr. Earlie RavelingKelly Nagasawa ([facility]

## 2017-11-27 DIAGNOSIS — E785 Hyperlipidemia, unspecified: Secondary | ICD-10-CM | POA: Diagnosis not present

## 2017-11-27 DIAGNOSIS — E119 Type 2 diabetes mellitus without complications: Secondary | ICD-10-CM | POA: Diagnosis not present

## 2017-11-27 DIAGNOSIS — I10 Essential (primary) hypertension: Secondary | ICD-10-CM | POA: Diagnosis not present

## 2017-11-27 DIAGNOSIS — I350 Nonrheumatic aortic (valve) stenosis: Secondary | ICD-10-CM | POA: Diagnosis not present

## 2017-11-27 DIAGNOSIS — Z952 Presence of prosthetic heart valve: Secondary | ICD-10-CM | POA: Diagnosis not present

## 2017-12-03 ENCOUNTER — Ambulatory Visit: Payer: BLUE CROSS/BLUE SHIELD | Admitting: Physician Assistant

## 2017-12-06 ENCOUNTER — Other Ambulatory Visit: Payer: Self-pay | Admitting: Physician Assistant

## 2017-12-06 DIAGNOSIS — R609 Edema, unspecified: Secondary | ICD-10-CM

## 2017-12-07 DIAGNOSIS — I350 Nonrheumatic aortic (valve) stenosis: Secondary | ICD-10-CM | POA: Diagnosis not present

## 2017-12-07 DIAGNOSIS — Z952 Presence of prosthetic heart valve: Secondary | ICD-10-CM | POA: Diagnosis not present

## 2017-12-25 ENCOUNTER — Other Ambulatory Visit: Payer: Self-pay | Admitting: Physician Assistant

## 2017-12-25 DIAGNOSIS — I152 Hypertension secondary to endocrine disorders: Secondary | ICD-10-CM

## 2017-12-25 DIAGNOSIS — E1159 Type 2 diabetes mellitus with other circulatory complications: Secondary | ICD-10-CM

## 2017-12-25 DIAGNOSIS — I10 Essential (primary) hypertension: Principal | ICD-10-CM

## 2018-01-04 ENCOUNTER — Other Ambulatory Visit: Payer: Self-pay | Admitting: Physician Assistant

## 2018-01-04 DIAGNOSIS — R609 Edema, unspecified: Secondary | ICD-10-CM

## 2018-01-10 ENCOUNTER — Other Ambulatory Visit: Payer: Self-pay | Admitting: Physician Assistant

## 2018-01-10 DIAGNOSIS — E785 Hyperlipidemia, unspecified: Principal | ICD-10-CM

## 2018-01-10 DIAGNOSIS — E1169 Type 2 diabetes mellitus with other specified complication: Secondary | ICD-10-CM

## 2018-01-20 ENCOUNTER — Encounter: Payer: Self-pay | Admitting: Physician Assistant

## 2018-01-30 ENCOUNTER — Other Ambulatory Visit: Payer: Self-pay | Admitting: Physician Assistant

## 2018-01-30 DIAGNOSIS — R609 Edema, unspecified: Secondary | ICD-10-CM

## 2018-03-10 DIAGNOSIS — J111 Influenza due to unidentified influenza virus with other respiratory manifestations: Secondary | ICD-10-CM | POA: Diagnosis not present

## 2018-04-16 ENCOUNTER — Other Ambulatory Visit: Payer: Self-pay | Admitting: Physician Assistant

## 2018-04-16 DIAGNOSIS — E785 Hyperlipidemia, unspecified: Principal | ICD-10-CM

## 2018-04-16 DIAGNOSIS — E1169 Type 2 diabetes mellitus with other specified complication: Secondary | ICD-10-CM

## 2018-04-16 NOTE — Telephone Encounter (Signed)
Pt was due for DM follow up in November 2018.  Called and advised pt he is past due for OV- he states he does not want to schedule now. I advised him I could only send 30 day RF for Lipitor. Pt agreeable and states he wanted 30 day sent to [facility]- did not want RX to go to local pharmacy. Advised pt I would send 30 day to [facility] but he may need to call them and verify they can fill this. Pt agreed to call pharmacy.   30 day RX sent with note pt needs appt. Pt states he will call later and schedule appt

## 2018-04-19 DIAGNOSIS — E118 Type 2 diabetes mellitus with unspecified complications: Secondary | ICD-10-CM | POA: Diagnosis not present

## 2018-04-19 DIAGNOSIS — Z23 Encounter for immunization: Secondary | ICD-10-CM | POA: Diagnosis not present

## 2018-04-19 DIAGNOSIS — B20 Human immunodeficiency virus [HIV] disease: Secondary | ICD-10-CM | POA: Diagnosis not present

## 2018-04-19 DIAGNOSIS — E785 Hyperlipidemia, unspecified: Secondary | ICD-10-CM | POA: Diagnosis not present

## 2018-04-19 DIAGNOSIS — Z952 Presence of prosthetic heart valve: Secondary | ICD-10-CM | POA: Diagnosis not present

## 2018-04-19 LAB — BASIC METABOLIC PANEL
BUN: 31 — AB (ref 4–21)
Creatinine: 1 (ref 0.6–1.3)
Glucose: 238
Potassium: 4.3 (ref 3.4–5.3)
Sodium: 139 (ref 137–147)

## 2018-04-19 LAB — CBC AND DIFFERENTIAL
HCT: 39 — AB (ref 41–53)
Hemoglobin: 13.6 (ref 13.5–17.5)
PLATELETS: 183 (ref 150–399)
WBC: 6.5

## 2018-04-19 LAB — HEPATIC FUNCTION PANEL
ALT: 18 (ref 10–40)
AST: 14 (ref 14–40)
Alkaline Phosphatase: 87 (ref 25–125)

## 2018-04-19 LAB — LIPID PANEL
Cholesterol: 122 (ref 0–200)
HDL: 40 (ref 35–70)
LDL Cholesterol: 64
Triglycerides: 90 (ref 40–160)

## 2018-04-22 ENCOUNTER — Telehealth: Payer: Self-pay | Admitting: Physician Assistant

## 2018-04-22 NOTE — Telephone Encounter (Signed)
Pt advised and scheduled for next week.  

## 2018-04-22 NOTE — Telephone Encounter (Signed)
Received notification from ID physician that patient's diabetes is not well controlled. A1C 10.8 on 04/19/18 Please contact patient and schedule an appointment for diabetes mgmt

## 2018-05-03 ENCOUNTER — Ambulatory Visit (INDEPENDENT_AMBULATORY_CARE_PROVIDER_SITE_OTHER): Payer: BLUE CROSS/BLUE SHIELD | Admitting: Physician Assistant

## 2018-05-03 ENCOUNTER — Encounter: Payer: Self-pay | Admitting: Physician Assistant

## 2018-05-03 VITALS — BP 175/81 | HR 71 | Temp 97.7°F | Resp 18 | Ht 68.0 in | Wt 294.8 lb

## 2018-05-03 DIAGNOSIS — I350 Nonrheumatic aortic (valve) stenosis: Secondary | ICD-10-CM | POA: Diagnosis not present

## 2018-05-03 DIAGNOSIS — E1165 Type 2 diabetes mellitus with hyperglycemia: Secondary | ICD-10-CM

## 2018-05-03 DIAGNOSIS — Z9114 Patient's other noncompliance with medication regimen: Secondary | ICD-10-CM | POA: Diagnosis not present

## 2018-05-03 DIAGNOSIS — E1169 Type 2 diabetes mellitus with other specified complication: Secondary | ICD-10-CM

## 2018-05-03 DIAGNOSIS — Z9189 Other specified personal risk factors, not elsewhere classified: Secondary | ICD-10-CM

## 2018-05-03 DIAGNOSIS — E785 Hyperlipidemia, unspecified: Secondary | ICD-10-CM

## 2018-05-03 DIAGNOSIS — I10 Essential (primary) hypertension: Secondary | ICD-10-CM | POA: Diagnosis not present

## 2018-05-03 DIAGNOSIS — R0683 Snoring: Secondary | ICD-10-CM

## 2018-05-03 DIAGNOSIS — R609 Edema, unspecified: Secondary | ICD-10-CM | POA: Diagnosis not present

## 2018-05-03 DIAGNOSIS — E1159 Type 2 diabetes mellitus with other circulatory complications: Secondary | ICD-10-CM

## 2018-05-03 DIAGNOSIS — I152 Hypertension secondary to endocrine disorders: Secondary | ICD-10-CM

## 2018-05-03 LAB — POCT GLYCOSYLATED HEMOGLOBIN (HGB A1C): HEMOGLOBIN A1C: 9.7 % — AB (ref 4.0–5.6)

## 2018-05-03 MED ORDER — SITAGLIP PHOS-METFORMIN HCL ER 50-1000 MG PO TB24: 2.0000 | ORAL_TABLET | Freq: Every day | ORAL | 1 refills | Status: DC

## 2018-05-03 MED ORDER — CARVEDILOL 25 MG PO TABS: 25.0000 mg | ORAL_TABLET | Freq: Two times a day (BID) | ORAL | 1 refills | Status: DC

## 2018-05-03 MED ORDER — VALSARTAN 320 MG PO TABS: 320.0000 mg | ORAL_TABLET | Freq: Every day | ORAL | 1 refills | Status: DC

## 2018-05-03 MED ORDER — EXENATIDE ER 2 MG/0.85ML ~~LOC~~ AUIJ: 2.0000 mg | AUTO-INJECTOR | SUBCUTANEOUS | 2 refills | Status: DC

## 2018-05-03 MED ORDER — FUROSEMIDE 20 MG PO TABS: 20.0000 mg | ORAL_TABLET | Freq: Every day | ORAL | 1 refills | Status: DC

## 2018-05-03 MED ORDER — ATORVASTATIN CALCIUM 10 MG PO TABS: 10.0000 mg | ORAL_TABLET | Freq: Every day | ORAL | 1 refills | Status: DC

## 2018-05-03 NOTE — Patient Instructions (Addendum)
1. Schedule Follow-up Appointment with Cardiology for your Aortic Stenosis [phone] [facility])  [address]  [place], [place] [address]  2. Please complete and return 3 stool cards for colon cancer screening  3. Schedule an appointment with Nutrition and Diabetes Education Services at [facility] [phone] [address], [address] [place], [place] [address]   If there is any chance that Bydureon (injection) is not covered by your insurance or is too expensive, call insurance and ask if there is a GLP-1 medication that is on your formulary or preferred. You can ask the pharmacy to do this for you as well   Diabetes Preventive Care: - annual foot exam  - annual dilated eye exam with an eye doctor - self foot exams at least weekly - pneumonia vaccine once (booster in 5 years and at age 34) - annual influenza vaccine - twice yearly dental cleanings and yearly exam - goal blood pressure <140/90, ideally <130/80 - LDL cholesterol <70 - A1C <6.9 - body mass index (BMI) <25.0 - follow-up every 3 months if your A1C is not at goal - follow-up every 6 months if diabetes is well controlled   Stool for Occult Blood Test Why am I having this test? Stool for occult blood, or fecal occult blood test (FOBT), is a test that is used to check for gastrointestinal (GI) bleeding, which may be a sign of colon cancer. This test can also detect small amounts of blood in your stool (feces) from other causes, such as ulcers, bleeding blood vessels, or hemorrhoids. This test may be done as part of an annual routine exam to screen for colorectal cancer. Screening is recommended for all adults starting at age 47 and continuing until age 28. Your health care provider may recommend screening at age 77. What is being tested? This test checks for blood in your stool. What kind of sample is taken?  A stool sample is required for this test. Your  health care provider may collect the sample with a swab of the rectum. Or, you may be instructed to collect the sample in a container at home. If you are instructed to collect the sample at home, your health care provider will give you the instructions and supplies that you will need. How do I collect samples at home? You may be asked to collect stool samples at home. Follow instructions from your health care provider about how to collect the samples. When collecting a stool sample at home, make sure you:  Use supplies and instructions that you received from the lab.  Have a bowel movement directly into a clean, dry container. Do not collect stool from the water in the toilet.  Transfer the sample into the germ-free (sterile) cup that you received from the lab.  Do not let any toilet paper or urine get into the cup.  Wash your hands with soap and water after collecting the sample.  Return the samples to the lab as instructed. How do I prepare for this test?  Do not eat any red meat during the 3 days before your test.  Follow instructions from your health care provider about eating and drinking before the test. Your health care provider may instruct you to avoid other foods or substances. Tell a health care provider about:  All medicines you are taking, including vitamins, herbs, eye drops, creams, and over-the-counter medicines. You may be instructed to avoid certain medicines that are known to interfere with this test.  Any recent dental procedures you have had. How are the results reported? Your test results will be reported as either positive or negative for blood in your stool. What do the results mean? A negative test result means that there is no blood within the stool. A negative result is considered normal. A positive test result may mean that there is blood in the stool. Causes of blood in the stool include:  GI tumors.  Certain GI diseases.  GI trauma or recent  surgery.  Hemorrhoids. If your test result is positive, additional tests may be needed to find the source of the bleeding. Talk with your health care provider about what your results mean. Questions to ask your health care provider Ask your health care provider, or the department that is doing the test:  When will my results be ready?  How will I get my results?  What are my treatment options?  What other tests do I need?  What are my next steps? Summary  Stool for occult blood, or fecal occult blood test (FOBT), is a test that is used to check for gastrointestinal (GI) bleeding, which may be a sign of colon cancer.  This test can also detect small amounts of blood in your stool (feces) from other causes, such as ulcers, bleeding blood vessels, or hemorrhoids.  Your health care provider may collect the sample with a swab of the rectum. Or, you may be instructed to collect the sample in a container at home.  A positive test result may mean that there is blood in the stool. This information is not intended to replace advice given to you by your health care provider. Make sure you discuss any questions you have with your health care provider. Document Released: 03/17/2004 Document Revised: 04/12/2017 Document Reviewed: 10/17/2016 Elsevier Interactive Patient Education  2019 [employer].

## 2018-05-03 NOTE — Progress Notes (Signed)
HPI:                                                                Clifford Strong is a 68 y.o. male who presents to [place]: [facility] today for medication management  DM2: overdue for his follow-up by 3 months. Has had issues with medication compliance related to his diabetes. He is currently only taking Janumet XR. Today he reports that he discontinued Saxenda because "it wasn't doing me any good." Upon further questioning, he states he stopped it because he wasn't losing weight. It was explained at last office visit that GLP-1 is also helping with his glycemic control and he should continue this even if he is not losing weight. Patient does not recall this conversation. He did not follow-up with diabetes educator. They attempted to call him 3 times to schedule but he never called back. Denies polydipsia, polyuria, polyphagia. Denies blurred vision or vision change. Denies extremity pain, altered sensation and paresthesias.  Denies ulcers/wounds on feet.  Aortic stenosis: he never followed up with Cardiology. He was due in November. He states he does not want valve replacement and his shortness of breath is much better. However, he is not exercising at all and still having peripheral edema for which he is taking Lasix 20 mg daily.  HIV: He had labs completed by ID on 04/19/18. Dr. Lilia Pro increased his Lipitor to 40 mg. Patient states he has not increased his dose and does not want to do so.  HTN: taking Carvedilol and Valsartan. Compliant with medications. Does not checks BP's at home.  Denies vision change, headache, chest pain with exertion, orthopnea, lightheadedness, syncope and edema. Risk factors include: DM2, obesity  Past Medical History:  Diagnosis Date  . Aortic stenosis   . Degenerative joint disease (DJD) of hip 04/09/2017  . Diabetes mellitus without complication ([place])   . Heart murmur   . HIV infection ([place])   . Hypertension   . Obesity     Past Surgical History:  Procedure Laterality Date  . AORTIC VALVE REPLACEMENT  2014   Social History   Tobacco Use  . Smoking status: Never Smoker  . Smokeless tobacco: Never Used  Substance Use Topics  . Alcohol use: No    Frequency: Never   family history includes Diabetes in his father and mother; Heart attack in his mother; Hypertension in his father and mother; Lung disease in his father.    ROS: negative except as noted in the HPI  Medications: Current Outpatient Medications  Medication Sig Dispense Refill  . aspirin EC 81 MG tablet Take by mouth.    Marland Kitchen atorvastatin (LIPITOR) 10 MG tablet Take 1 tablet (10 mg total) by mouth at bedtime. Pt needs appt for refills. Advised only 30 days- he wanted to use [employer]. 90 tablet 1  . Ergocalciferol 2000 units CAPS Take 1 capsule by mouth daily.    . furosemide (LASIX) 20 MG tablet Take 1 tablet (20 mg total) by mouth daily. 90 tablet 1  . SitaGLIPtin-MetFORMIN HCl (JANUMET XR) 50-1000 MG TB24 Take 2 tablets by mouth daily with breakfast. 180 tablet 1  . valsartan (DIOVAN) 320 MG tablet Take 1 tablet (320 mg total) by mouth daily. 90 tablet 1  .  carvedilol (COREG) 25 MG tablet Take 1 tablet (25 mg total) by mouth 2 (two) times daily with a meal. 180 tablet 1  . Exenatide ER (BYDUREON BCISE) 2 MG/0.85ML AUIJ Inject 2 mg into the skin once a week. 4 pen 2   No current facility-administered medications for this visit.    Allergies  Allergen Reactions  . Lisinopril Cough       Objective:  BP (!) 175/81   Pulse 71   Temp 97.7 F (36.5 C)   Resp 18   Ht _0  (1.727 m)   Wt 294 lb 12.8 oz (133.7 kg)   BMI 44.82 kg/m  Gen:  alert, not ill-appearing, no distress, appropriate for age, obese male HEENT: head normocephalic without obvious abnormality, conjunctiva and cornea clear, trachea midline Pulm: Normal work of breathing, normal phonation, clear to auscultation bilaterally, no wheezes, rales or rhonchi CV: Normal rate,  regular rhythm, s1 and s2 distinct, no murmurs, clicks or rubs  Neuro: alert and oriented x 3, no tremor MSK: extremities atraumatic, normal gait and station, 2+ peripheral edema bilaterally Skin: intact, no rashes on exposed skin, no jaundice, no cyanosis Psych: well-groomed, cooperative, good eye contact, euthymic mood, affect mood-congruent, speech is articulate, and thought processes clear and goal-directed  Diabetic Foot Exam - Simple   Simple Foot Form Diabetic Foot exam was performed with the following findings:  Yes 05/03/2018 10:40 AM  Visual Inspection No deformities, no ulcerations, no other skin breakdown bilaterally:  Yes Sensation Testing Intact to touch and monofilament testing bilaterally:  Yes Pulse Check Posterior Tibialis and Dorsalis pulse intact bilaterally:  Yes Comments      Results for orders placed or performed in visit on 05/03/18 (from the past 72 hour(s))  POCT HgB A1C     Status: Abnormal   Collection Time: 05/03/18  9:48 AM  Result Value Ref Range   Hemoglobin A1C 9.7 (A) 4.0 - 5.6 %   HbA1c POC (<> result, manual entry)     HbA1c, POC (prediabetic range)     HbA1c, POC (controlled diabetic range)     The ASCVD Risk score Mikey Bussing DC Jr., et al., 2013) failed to calculate for the following reasons:   The valid total cholesterol range is 130 to 320 mg/dL  Labs Care Everywhere 04/19/2018 LDL 64  Scr 1.02 eGFR 75  Glucose 238   Assessment and Plan: 68 y.o. male with   .Jerric was seen today for follow-up.  Diagnoses and all orders for this visit:  Uncontrolled type 2 diabetes mellitus with hyperglycemia ([place]) -     POCT HgB A1C -     Exenatide ER (BYDUREON BCISE) 2 MG/0.85ML AUIJ; Inject 2 mg into the skin once a week. -     SitaGLIPtin-MetFORMIN HCl (JANUMET XR) 50-1000 MG TB24; Take 2 tablets by mouth daily with breakfast.  Hypertension associated with diabetes (HCC) -     carvedilol (COREG) 25 MG tablet; Take 1 tablet (25 mg total) by  mouth 2 (two) times daily with a meal. -     furosemide (LASIX) 20 MG tablet; Take 1 tablet (20 mg total) by mouth daily. -     valsartan (DIOVAN) 320 MG tablet; Take 1 tablet (320 mg total) by mouth daily.  Dyslipidemia associated with type 2 diabetes mellitus (HCC) -     atorvastatin (LIPITOR) 10 MG tablet; Take 1 tablet (10 mg total) by mouth at bedtime. Pt needs appt for refills. Advised only 30 days- he wanted to use  [employer].  Peripheral edema -     furosemide (LASIX) 20 MG tablet; Take 1 tablet (20 mg total) by mouth daily.  Nonrheumatic aortic valve stenosis  Noncompliance with medication regimen   Personally reviewed lab results in care everywhere from April 19, 2018  DM2 Poorly controlled secondary to noncompliance with medication regimen Long discussion with patient about the risks of uncontrolled diabetes and potential complications.  Explained that we are at a point where insulin is the next best step.  Patient was adamant that he does not want to have to monitor his blood sugar or take insulin.  We will try starting him on by durian since there is no self titration and dosing is just once a week rather than daily with Saxenda.  He understands that this is not for weight loss and we are mainly doing this for glycemic control.  He declines to follow-up with the diabetes educator   HTN BP Readings from Last 3 Encounters:  05/03/18 (!) 175/81  11/08/17 (!) 156/86  11/02/17 (!) 168/98  Poorly controlled HTN, there is likely underlying OSA 2/2 to morbid obesity. Sleep study was ordered 1 year ago. Unclear why this wasn't performed. Re-ordered home sleep test today Reviewed Cardiology's treatment plan New Rx for Coreg 25 mg per Cardiology sent to pharmacy Cont Lasix 20 mg QD and Valsartan 320 mg  AS I discussed that he even if he does not desire surgery, he still needs to follow-up with Cardiology for possible anticoagulation and surveillance of his valvular heart  disease Cont ASA 81 mg Counseled to contact their office and schedule f/u. He was provided with the phone number  Patient education and anticipatory guidance given Patient agrees with treatment plan Follow-up in 2 months for diabetes and hypertension or sooner as needed if symptoms worsen or fail to improve   Darlyne Russian PA-C

## 2018-05-06 ENCOUNTER — Encounter: Payer: Self-pay | Admitting: Physician Assistant

## 2018-05-07 ENCOUNTER — Encounter: Payer: Self-pay | Admitting: Physician Assistant

## 2018-05-09 ENCOUNTER — Encounter: Payer: Self-pay | Admitting: Physician Assistant

## 2018-05-10 ENCOUNTER — Encounter: Payer: Self-pay | Admitting: Physician Assistant

## 2018-06-11 ENCOUNTER — Encounter: Payer: Self-pay | Admitting: Physician Assistant

## 2018-06-11 DIAGNOSIS — E1165 Type 2 diabetes mellitus with hyperglycemia: Secondary | ICD-10-CM

## 2018-06-11 MED ORDER — EXENATIDE ER 2 MG/0.85ML ~~LOC~~ AUIJ: 2.0000 mg | AUTO-INJECTOR | SUBCUTANEOUS | 0 refills | Status: DC

## 2018-06-19 NOTE — Telephone Encounter (Signed)
Received a fax from [facility] that Bydureon has been approved through 03/06/2019. Pharmacy aware and form sent to scan.

## 2018-06-20 ENCOUNTER — Encounter: Payer: Self-pay | Admitting: Physician Assistant

## 2018-07-05 ENCOUNTER — Ambulatory Visit: Payer: BLUE CROSS/BLUE SHIELD | Admitting: Physician Assistant

## 2018-07-15 ENCOUNTER — Other Ambulatory Visit: Payer: Self-pay | Admitting: Physician Assistant

## 2018-07-15 DIAGNOSIS — E1169 Type 2 diabetes mellitus with other specified complication: Secondary | ICD-10-CM

## 2018-07-22 ENCOUNTER — Other Ambulatory Visit: Payer: Self-pay | Admitting: Physician Assistant

## 2018-08-05 ENCOUNTER — Other Ambulatory Visit: Payer: Self-pay | Admitting: Physician Assistant

## 2018-08-05 DIAGNOSIS — E1165 Type 2 diabetes mellitus with hyperglycemia: Secondary | ICD-10-CM

## 2018-09-23 ENCOUNTER — Other Ambulatory Visit: Payer: Self-pay | Admitting: Physician Assistant

## 2018-10-11 DIAGNOSIS — B20 Human immunodeficiency virus [HIV] disease: Secondary | ICD-10-CM | POA: Diagnosis not present

## 2018-10-11 DIAGNOSIS — R894 Abnormal immunological findings in specimens from other organs, systems and tissues: Secondary | ICD-10-CM | POA: Diagnosis not present

## 2018-10-11 DIAGNOSIS — Z952 Presence of prosthetic heart valve: Secondary | ICD-10-CM | POA: Diagnosis not present

## 2018-10-11 DIAGNOSIS — E118 Type 2 diabetes mellitus with unspecified complications: Secondary | ICD-10-CM | POA: Diagnosis not present

## 2018-11-18 ENCOUNTER — Encounter: Payer: Self-pay | Admitting: Physician Assistant

## 2018-11-18 MED ORDER — VALSARTAN-HYDROCHLOROTHIAZIDE 320-25 MG PO TABS: ORAL_TABLET | ORAL | 0 refills | Status: DC

## 2018-11-21 ENCOUNTER — Encounter: Payer: Self-pay | Admitting: Physician Assistant

## 2018-11-21 ENCOUNTER — Other Ambulatory Visit: Payer: Self-pay

## 2018-11-21 ENCOUNTER — Ambulatory Visit (INDEPENDENT_AMBULATORY_CARE_PROVIDER_SITE_OTHER): Payer: BC Managed Care – PPO | Admitting: Physician Assistant

## 2018-11-21 VITALS — BP 150/78 | HR 72 | Temp 97.8°F | Wt 291.0 lb

## 2018-11-21 DIAGNOSIS — E1159 Type 2 diabetes mellitus with other circulatory complications: Secondary | ICD-10-CM | POA: Diagnosis not present

## 2018-11-21 DIAGNOSIS — I350 Nonrheumatic aortic (valve) stenosis: Secondary | ICD-10-CM

## 2018-11-21 DIAGNOSIS — E871 Hypo-osmolality and hyponatremia: Secondary | ICD-10-CM | POA: Diagnosis not present

## 2018-11-21 DIAGNOSIS — I10 Essential (primary) hypertension: Secondary | ICD-10-CM

## 2018-11-21 DIAGNOSIS — E1165 Type 2 diabetes mellitus with hyperglycemia: Secondary | ICD-10-CM

## 2018-11-21 DIAGNOSIS — I152 Hypertension secondary to endocrine disorders: Secondary | ICD-10-CM

## 2018-11-21 DIAGNOSIS — E1169 Type 2 diabetes mellitus with other specified complication: Secondary | ICD-10-CM | POA: Diagnosis not present

## 2018-11-21 DIAGNOSIS — R609 Edema, unspecified: Secondary | ICD-10-CM | POA: Diagnosis not present

## 2018-11-21 DIAGNOSIS — E785 Hyperlipidemia, unspecified: Secondary | ICD-10-CM | POA: Diagnosis not present

## 2018-11-21 LAB — POCT GLYCOSYLATED HEMOGLOBIN (HGB A1C): HbA1c, POC (controlled diabetic range): 13.2 % — AB (ref 0.0–7.0)

## 2018-11-21 MED ORDER — ATORVASTATIN CALCIUM 40 MG PO TABS: 40.0000 mg | ORAL_TABLET | Freq: Every day | ORAL | 1 refills | Status: DC

## 2018-11-21 MED ORDER — VALSARTAN 320 MG PO TABS: 320.0000 mg | ORAL_TABLET | Freq: Every day | ORAL | 1 refills | Status: DC

## 2018-11-21 MED ORDER — FUROSEMIDE 20 MG PO TABS: 20.0000 mg | ORAL_TABLET | Freq: Two times a day (BID) | ORAL | 1 refills | Status: DC

## 2018-11-21 MED ORDER — JANUMET XR 50-1000 MG PO TB24: 1.0000 | ORAL_TABLET | Freq: Every day | ORAL | 1 refills | Status: DC

## 2018-11-21 MED ORDER — TRULICITY 0.75 MG/0.5ML ~~LOC~~ SOAJ: SUBCUTANEOUS | 1 refills | Status: AC

## 2018-11-21 MED ORDER — CARVEDILOL 25 MG PO TABS: 25.0000 mg | ORAL_TABLET | Freq: Two times a day (BID) | ORAL | 1 refills | Status: DC

## 2018-11-21 NOTE — Progress Notes (Signed)
HPI:                                                                Clifford Strong is a 68 y.o. male who presents to [place]: [place] today for medication management  DM2: overdue for 3 month follow-up. Has had issues with medication compliance related to his diabetes and poor glycemic control. Last A1C Feb 2020 9.7. He is currently only taking Janumet XR. He never started Bydureon. Denies polydipsia, polyuria, polyphagia. Denies blurred vision or vision change. Denies extremity pain, altered sensation and paresthesias.  Denies ulcers/wounds on feet.  Aortic stenosis: severe, last echo 12/07/2017. He never followed up with Cardiology. He was due in November. He states he does not want valve replacement and his shortness of breath is much better. He is riding his stationary bike almost every day for 30 minutes. He is not sure if he is taking Furosemide.   HIV: followed by [facility], Dr. Lilia Pro. He is compliant with Biktarvy.  HTN: taking Carvedilol and Valsartan/HCTZ. Compliant with medications.  He checks his BP with a wrist cuff at home SBP 130's-140 DBP low 80's He is riding his stationary bike almost every day for 30 minutes. Denies vision change, headache, chest pain with exertion, orthopnea, lightheadedness, syncope and edema. Risk factors include: DM2, obesity  Past Medical History:  Diagnosis Date  . Aortic stenosis   . Degenerative joint disease (DJD) of hip 04/09/2017  . Diabetes mellitus without complication ([place])   . Heart murmur   . HIV infection ([place])   . Hypertension   . Obesity    Past Surgical History:  Procedure Laterality Date  . AORTIC VALVE REPLACEMENT  2014   Social History   Tobacco Use  . Smoking status: Never Smoker  . Smokeless tobacco: Never Used  Substance Use Topics  . Alcohol use: No    Frequency: Never   family history includes Diabetes in his father and mother; Heart attack in his mother; Hypertension in  his father and mother; Lung disease in his father.    ROS: negative except as noted in the HPI  Medications: Current Outpatient Medications  Medication Sig Dispense Refill  . aspirin EC 81 MG tablet Take by mouth.    . bictegravir-emtricitabine-tenofovir AF (BIKTARVY) 50-200-25 MG TABS tablet Take 1 tablet by mouth daily.    . carvedilol (COREG) 25 MG tablet Take 1 tablet (25 mg total) by mouth 2 (two) times daily with a meal. 180 tablet 1  . Ergocalciferol 2000 units CAPS Take 1 capsule by mouth daily.    . furosemide (LASIX) 20 MG tablet Take 1 tablet (20 mg total) by mouth 2 (two) times daily. 180 tablet 1  . SitaGLIPtin-MetFORMIN HCl (JANUMET XR) 50-1000 MG TB24 Take 1 tablet by mouth daily. 90 tablet 1  . atorvastatin (LIPITOR) 40 MG tablet Take 1 tablet (40 mg total) by mouth daily. 90 tablet 1  . Dulaglutide (TRULICITY) 8.32 PQ/9.8YM SOPN Inject 0.75 mg into the skin once a week for 28 days, THEN 1.5 mg once a week. 20 pen 1  . valsartan (DIOVAN) 320 MG tablet Take 1 tablet (320 mg total) by mouth daily. 90 tablet 1  . valsartan-hydrochlorothiazide (DIOVAN-HCT) 320-25 MG tablet Take 1 tablet  by mouth daily. 90 tablet 0   No current facility-administered medications for this visit.    Allergies  Allergen Reactions  . Lisinopril Cough       Objective:  BP (!) 150/78   Pulse 72   Temp 97.8 F (36.6 C) (Oral)   Wt 291 lb (132 kg)   SpO2 96%   BMI 44.25 kg/m   Wt Readings from Last 3 Encounters:  11/21/18 291 lb (132 kg)  05/03/18 294 lb 12.8 oz (133.7 kg)  11/08/17 300 lb (136.1 kg)   Temp Readings from Last 3 Encounters:  11/21/18 97.8 F (36.6 C) (Oral)  05/03/18 97.7 F (36.5 C)   BP Readings from Last 3 Encounters:  11/21/18 (!) 150/78  05/03/18 (!) 175/81  11/08/17 (!) 156/86   Pulse Readings from Last 3 Encounters:  11/21/18 72  05/03/18 71  11/08/17 87    Gen:  alert, not ill-appearing, no distress, appropriate for age, obese male HEENT: head  normocephalic without obvious abnormality, conjunctiva and cornea clear, trachea midline Pulm: Normal work of breathing, normal phonation, clear to auscultation bilaterally, no wheezes, rales or rhonchi CV: Normal rate, regular rhythm, s1 and s2 distinct, grade 2 systolic murmur, clicks or rubs  Neuro: alert and oriented x 3, no tremor MSK: extremities atraumatic, normal gait and station, 1+ peripheral edema bilaterally Skin: intact, no rashes on exposed skin, no jaundice, no cyanosis Psych: well-groomed, cooperative, good eye contact, euthymic mood, affect mood-congruent, speech is articulate, and thought processes clear and goal-directed  Diabetic Foot Exam - Simple   No data filed     Labs Care Everywhere 10/11/18 Glucose 365 Sodium 133 BUN 26 Scr 1.18 GFR 63   No results found for this or any previous visit (from the past 72 hour(s)). The ASCVD Risk score Mikey Bussing DC Jr., et al., 2013) failed to calculate for the following reasons:   The valid total cholesterol range is 130 to 320 mg/dL  Labs Care Everywhere 04/19/2018 LDL 64  Scr 1.02 eGFR 75  Glucose 238   Assessment and Plan: 68 y.o. male with   .[name] was seen today for medication management.  Diagnoses and all orders for this visit:  Uncontrolled type 2 diabetes mellitus with hyperglycemia (HCC) -     POCT HgB A1C -     SitaGLIPtin-MetFORMIN HCl (JANUMET XR) 50-1000 MG TB24; Take 1 tablet by mouth daily. -     Dulaglutide (TRULICITY) 6.57 QI/6.9GE SOPN; Inject 0.75 mg into the skin once a week for 28 days, THEN 1.5 mg once a week.  Peripheral edema -     furosemide (LASIX) 20 MG tablet; Take 1 tablet (20 mg total) by mouth 2 (two) times daily.  Hypertension associated with diabetes ([place]) -     furosemide (LASIX) 20 MG tablet; Take 1 tablet (20 mg total) by mouth 2 (two) times daily. -     valsartan (DIOVAN) 320 MG tablet; Take 1 tablet (320 mg total) by mouth daily.  Hyperlipidemia associated with type 2  diabetes mellitus (HCC) -     atorvastatin (LIPITOR) 40 MG tablet; Take 1 tablet (40 mg total) by mouth daily.  Severe aortic stenosis -     carvedilol (COREG) 25 MG tablet; Take 1 tablet (25 mg total) by mouth 2 (two) times daily with a meal. -     furosemide (LASIX) 20 MG tablet; Take 1 tablet (20 mg total) by mouth 2 (two) times daily.   Personally reviewed lab results in care everywhere  from April 19, 2018  DM2 Poorly controlled secondary to noncompliance with medication regimen Long discussion with patient about the risks of uncontrolled diabetes and potential complications. He continues to decline insulin therapy He has historically not been compliant with GLP-1, but we will try one more time Counseled on Trulicity pen and to self-titrate to 1.5 mg after 1 month A1C goal <7.0 BP goal <130/80 LDL goal <70, cont statin Urine microalbumin: on ARB Diabetic foot exam: UTD Diabetic eye exam: overdue, reminded to scheduled Re-check A1C in 3 months   HTN BP Readings from Last 3 Encounters:  11/21/18 (!) 150/78  05/03/18 (!) 175/81  11/08/17 (!) 156/86  Poorly controlled HTN, there is likely underlying OSA 2/2 to morbid obesity. Sleep study was ordered 1 year ago. Unclear why this wasn't performed. Re-ordered home sleep test in March Personally reviewed labs dated 10/11/18, pseudo-hyponatremia due to hyperglycemia with normal Scr and GFR=63 Increase Lasix to 20 mg bid Cont Valsartan 320 mg and Carvedilol 25 mg bid  AS I again discussed that he even if he does not desire surgery, he still needs to follow-up with Cardiology for possible anticoagulation and surveillance of his valvular heart disease with serial echos Cont ASA 81 mg Counseled to contact their office and schedule f/u.   Patient education and anticipatory guidance given Patient agrees with treatment plan Follow-up in 3 months for diabetes and hypertension or sooner as needed if symptoms worsen or fail to  improve   Darlyne Russian PA-C

## 2018-11-21 NOTE — Patient Instructions (Addendum)
For blood pressure: 1. Stop Valsartan-HCTZ combo pill and switch to plain Valsartan (No HCTZ) 2. Increase Furosemide (Lasix) to 20 mg twice a day. This is a more potent diuretic than HCTZ. You will not want to take this medication if you are sick with a GI illness or dehydrated Schedule follow-up appointment with Cardiology for your Aortic Stenosis  For your diabetes: 1. Start Trulicity 8.92 mg once a week for 4 doses (1 month). Then increase to 1.5 mg once a week. You will use 2 pens, but need to inject in separate injection sites.  2. Continue your Janumet pill 3. Limit your carbs to 45g per meal. Limit soda to 1-2 times per week and do not drink more than a single 8 ounce can at a time  Diabetes Preventive Care: - annual foot exam, self foot exams at least weekly - annual dilated eye exam with an eye doctor - pneumonia vaccine once (booster in 5 years and at age 76) - annual influenza vaccine - twice yearly dental cleanings and yearly exam - blood pressure goal <130/80 - LDL cholesterol goal <70 - A1C goal <7.0 - body mass index (BMI) less than or equal to 29.0 (ideally below 25) - follow-up every 3 months if your A1C is not at goal - follow-up every 6 months if diabetes is well controlled

## 2018-11-27 ENCOUNTER — Telehealth: Payer: Self-pay | Admitting: Sports Medicine

## 2018-11-27 NOTE — Telephone Encounter (Signed)
Received fax from [employer] that Trulicity requires a PA. Information has been sent to the insurance company. Awaiting determination.   

## 2018-11-28 NOTE — Telephone Encounter (Signed)
Received fax from insurance that Trulicity was approved through 03/06/2019. Pharmacy aware and form sent to scan.

## 2018-12-02 ENCOUNTER — Other Ambulatory Visit: Payer: Self-pay | Admitting: Physician Assistant

## 2018-12-15 DIAGNOSIS — I350 Nonrheumatic aortic (valve) stenosis: Secondary | ICD-10-CM | POA: Insufficient documentation

## 2018-12-15 DIAGNOSIS — E871 Hypo-osmolality and hyponatremia: Secondary | ICD-10-CM | POA: Insufficient documentation

## 2018-12-16 ENCOUNTER — Telehealth: Payer: Self-pay | Admitting: Physician Assistant

## 2018-12-16 NOTE — Telephone Encounter (Signed)
Patient hasn't been taking neither the Valsartan nor the Valsartan-HCTZ. Removed from list and cancelled with the pharmacy. Advised patient to restart the Valsartan 320 mg.

## 2018-12-16 NOTE — Telephone Encounter (Signed)
Left VM for Pt to see how he is taking his BP Rx's. Awaiting callback.

## 2018-12-16 NOTE — Telephone Encounter (Signed)
-----   Message from [place], [place] sent at 12/15/2018  8:28 PM EDT ----- Regarding: Valsartan refills Hi Clifford Strong, Patient keeps getting refills of Valsartan HCTZ combo pill He should be taking Valsartan only with Furosemide and Carvedilol Can you make sure he is not filling both Valsartan prescriptions because that would be very very dangerous for his kidneys? Thanks! Evlyn Clines

## 2019-01-16 ENCOUNTER — Other Ambulatory Visit: Payer: Self-pay | Admitting: Physician Assistant

## 2019-01-16 DIAGNOSIS — I350 Nonrheumatic aortic (valve) stenosis: Secondary | ICD-10-CM

## 2019-02-21 ENCOUNTER — Ambulatory Visit: Payer: BC Managed Care – PPO | Admitting: Osteopathic Medicine

## 2019-03-21 ENCOUNTER — Encounter: Payer: Self-pay | Admitting: Osteopathic Medicine

## 2019-03-21 ENCOUNTER — Other Ambulatory Visit: Payer: Self-pay

## 2019-03-21 ENCOUNTER — Ambulatory Visit (INDEPENDENT_AMBULATORY_CARE_PROVIDER_SITE_OTHER): Payer: Medicare Other | Admitting: Osteopathic Medicine

## 2019-03-21 VITALS — BP 139/85 | HR 93 | Temp 97.4°F | Wt 285.1 lb

## 2019-03-21 DIAGNOSIS — I10 Essential (primary) hypertension: Secondary | ICD-10-CM

## 2019-03-21 DIAGNOSIS — E1165 Type 2 diabetes mellitus with hyperglycemia: Secondary | ICD-10-CM

## 2019-03-21 LAB — POCT GLYCOSYLATED HEMOGLOBIN (HGB A1C): HbA1c POC (<> result, manual entry): >14 % (ref 4.0–5.6)

## 2019-03-21 MED ORDER — TRESIBA FLEXTOUCH 200 UNIT/ML ~~LOC~~ SOPN: 10.0000 [IU] | PEN_INJECTOR | Freq: Every day | SUBCUTANEOUS | 11 refills | Status: DC

## 2019-03-21 NOTE — Patient Instructions (Addendum)
Meds ordered this encounter  Medications  . Insulin Degludec (TRESIBA FLEXTOUCH) 200 UNIT/ML SOPN    Sig: Inject 10-100 Units into the skin daily. Start at 10 units daily for 2 days, increase by 2 units at a time every 2 days until fasting sugars at goal of 100-150    Dispense:  6 pen    Refill:  11     STOP  Trulicity Janumet  RESTART Valsartan 320 mg daily

## 2019-03-21 NOTE — Progress Notes (Signed)
Clifford Strong is a 69 y.o. male who presents to  [facility] at [facility]  today, 03/21/19, seeking care for the following:  The primary encounter diagnosis was Uncontrolled type 2 diabetes mellitus with hyperglycemia (HCC). A diagnosis of Essential hypertension was also pertinent to this visit.    ASSESSMENT & PLAN with other pertinent history/findings:  Uncontrolled diabetes.    Last visit with Vinetta Bergamo 11/20/2021 did note nonadherence to medication regimen and A1C increase from prior visit.  At that point patient was declining insulin therapy despite A1c of 13.2.  Today, A1c is greater than 14.  Last routine labs to monitor lipids, CBC, metabolic panel was 04/2018 almost a year ago.   Today 03/21/19 A1c greater than 14, advised patient that we would be starting insulin today and he is agreeable.  To stop his Trulicity and Janumet to simplify his medication regimen.  Hypertension, not at goal  On intake today BP 185/109, rechecked thankfully better at 139/85 but still above goal.  He has not been taking the valsartan, we discussed restarting this medication.   Orders Placed This Encounter  Procedures  . POCT HgB A1C   Results for orders placed or performed in visit on 03/21/19 (from the past 24 hour(s))  POCT HgB A1C     Status: None   Collection Time: 03/21/19  9:58 AM  Result Value Ref Range   Hemoglobin A1C     HbA1c POC (<> result, manual entry) >14.0 4.0 - 5.6 %   HbA1c, POC (prediabetic range)     HbA1c, POC (controlled diabetic range)       Patient Instructions   Meds ordered this encounter  Medications  . Insulin Degludec (TRESIBA FLEXTOUCH) 200 UNIT/ML SOPN    Sig: Inject 10-100 Units into the skin daily. Start at 10 units daily for 2 days, increase by 2 units at a time every 2 days until fasting sugars at goal of 100-150    Dispense:  6 pen    Refill:  11     STOP  Trulicity Janumet  RESTART Valsartan 320 mg  daily           Follow-up instructions: Return in about 4 weeks (around 04/18/2019) for virtual visit recheck w/ Dr Lyn Hollingshead for Diabetes. 3 months in -office for recheck A1C .       There were no vitals taken for this visit.  No outpatient medications have been marked as taking for the 03/21/19 encounter (Office Visit) with Sunnie Nielsen, DO.    No results found for this or any previous visit (from the past 72 hour(s)).  No results found.  Depression screen [facility] 2/9 04/04/2017 03/12/2017  Decreased Interest 0 0  Down, Depressed, Hopeless 0 0  PHQ - 2 Score 0 0    No flowsheet data found.    All questions at time of visit were answered - patient instructed to contact office with any additional concerns or updates.  ER/RTC precautions were reviewed with the patient.  Please note: voice recognition software was used to produce this document, and typos may escape review. Please contact Dr. Lyn Hollingshead for any needed clarifications.

## 2019-03-26 ENCOUNTER — Encounter: Payer: Self-pay | Admitting: Osteopathic Medicine

## 2019-04-07 ENCOUNTER — Other Ambulatory Visit: Payer: Self-pay | Admitting: Physician Assistant

## 2019-04-07 DIAGNOSIS — I350 Nonrheumatic aortic (valve) stenosis: Secondary | ICD-10-CM

## 2019-04-07 DIAGNOSIS — I152 Hypertension secondary to endocrine disorders: Secondary | ICD-10-CM

## 2019-04-07 DIAGNOSIS — E1165 Type 2 diabetes mellitus with hyperglycemia: Secondary | ICD-10-CM

## 2019-04-07 DIAGNOSIS — R609 Edema, unspecified: Secondary | ICD-10-CM

## 2019-04-07 DIAGNOSIS — E1159 Type 2 diabetes mellitus with other circulatory complications: Secondary | ICD-10-CM

## 2019-04-09 MED ORDER — BD PEN NEEDLE NANO U/F 32G X 4 MM MISC: 12 refills | Status: DC

## 2019-04-09 NOTE — Telephone Encounter (Signed)
Patient is going to cancel the 04/18/19 appointment and is coming back in April for a follow up of the new medication.  Patient reports he did not get any other needles to screw on the pen. I have sent them to [facility]. He does not want a nurse visit and he reports "  I have done this before and I do not need to come in if I am not taking the medication".

## 2019-04-09 NOTE — Addendum Note (Signed)
Addended by: Arvilla Market on: 04/09/2019 12:03 PM   Modules accepted: Orders

## 2019-04-11 ENCOUNTER — Ambulatory Visit: Payer: Medicare Other

## 2019-04-18 ENCOUNTER — Ambulatory Visit: Payer: Medicare Other | Admitting: Osteopathic Medicine

## 2019-04-18 ENCOUNTER — Encounter: Payer: Self-pay | Admitting: Osteopathic Medicine

## 2019-04-21 NOTE — Telephone Encounter (Signed)
Please advise on if pt needs to test once or twice daily. RX pended.

## 2019-04-21 NOTE — Telephone Encounter (Signed)
Should be tid - he is on insulin

## 2019-04-22 MED ORDER — GLUCOSE BLOOD VI STRP: ORAL_STRIP | 12 refills | Status: DC

## 2019-04-22 MED ORDER — ONETOUCH ULTRASOFT LANCETS MISC: 12 refills | Status: DC

## 2019-05-13 ENCOUNTER — Encounter: Payer: Self-pay | Admitting: Osteopathic Medicine

## 2019-05-16 DIAGNOSIS — E785 Hyperlipidemia, unspecified: Secondary | ICD-10-CM | POA: Diagnosis not present

## 2019-05-16 DIAGNOSIS — E118 Type 2 diabetes mellitus with unspecified complications: Secondary | ICD-10-CM | POA: Diagnosis not present

## 2019-05-16 DIAGNOSIS — B20 Human immunodeficiency virus [HIV] disease: Secondary | ICD-10-CM | POA: Diagnosis not present

## 2019-05-16 DIAGNOSIS — Z952 Presence of prosthetic heart valve: Secondary | ICD-10-CM | POA: Diagnosis not present

## 2019-06-20 ENCOUNTER — Encounter: Payer: Self-pay | Admitting: Osteopathic Medicine

## 2019-06-20 ENCOUNTER — Other Ambulatory Visit: Payer: Self-pay

## 2019-06-20 ENCOUNTER — Ambulatory Visit (INDEPENDENT_AMBULATORY_CARE_PROVIDER_SITE_OTHER): Payer: BC Managed Care – PPO | Admitting: Osteopathic Medicine

## 2019-06-20 VITALS — BP 190/80 | HR 73 | Wt 286.0 lb

## 2019-06-20 DIAGNOSIS — E785 Hyperlipidemia, unspecified: Secondary | ICD-10-CM | POA: Diagnosis not present

## 2019-06-20 DIAGNOSIS — I10 Essential (primary) hypertension: Secondary | ICD-10-CM | POA: Diagnosis not present

## 2019-06-20 DIAGNOSIS — E1159 Type 2 diabetes mellitus with other circulatory complications: Secondary | ICD-10-CM | POA: Diagnosis not present

## 2019-06-20 DIAGNOSIS — E1169 Type 2 diabetes mellitus with other specified complication: Secondary | ICD-10-CM

## 2019-06-20 DIAGNOSIS — R609 Edema, unspecified: Secondary | ICD-10-CM | POA: Diagnosis not present

## 2019-06-20 DIAGNOSIS — E1165 Type 2 diabetes mellitus with hyperglycemia: Secondary | ICD-10-CM | POA: Diagnosis not present

## 2019-06-20 DIAGNOSIS — I350 Nonrheumatic aortic (valve) stenosis: Secondary | ICD-10-CM | POA: Diagnosis not present

## 2019-06-20 DIAGNOSIS — I152 Hypertension secondary to endocrine disorders: Secondary | ICD-10-CM

## 2019-06-20 LAB — POCT GLYCOSYLATED HEMOGLOBIN (HGB A1C): Hemoglobin A1C: 12.8 % — AB (ref 4.0–5.6)

## 2019-06-20 MED ORDER — CARVEDILOL 25 MG PO TABS: 25.0000 mg | ORAL_TABLET | Freq: Two times a day (BID) | ORAL | 1 refills | Status: DC

## 2019-06-20 MED ORDER — VALSARTAN 320 MG PO TABS: 320.0000 mg | ORAL_TABLET | Freq: Every day | ORAL | 1 refills | Status: DC

## 2019-06-20 MED ORDER — FUROSEMIDE 20 MG PO TABS: 20.0000 mg | ORAL_TABLET | Freq: Two times a day (BID) | ORAL | 1 refills | Status: DC

## 2019-06-20 MED ORDER — ASPIRIN EC 81 MG PO TBEC: 81.0000 mg | DELAYED_RELEASE_TABLET | Freq: Every day | ORAL | 3 refills | Status: DC

## 2019-06-20 MED ORDER — BICTEGRAVIR-EMTRICITAB-TENOFOV 50-200-25 MG PO TABS: 1.0000 | ORAL_TABLET | Freq: Every day | ORAL | 3 refills | Status: AC

## 2019-06-20 MED ORDER — ATORVASTATIN CALCIUM 40 MG PO TABS: 40.0000 mg | ORAL_TABLET | Freq: Every day | ORAL | 1 refills | Status: DC

## 2019-06-20 MED ORDER — OZEMPIC (0.25 OR 0.5 MG/DOSE) 2 MG/1.5ML ~~LOC~~ SOPN: 0.5000 mg | PEN_INJECTOR | SUBCUTANEOUS | 3 refills | Status: DC

## 2019-06-20 NOTE — Patient Instructions (Signed)
For Diabetes  Measure fasting blood sugar every day: goal for now is to get this to 100-150  Increase daily Insulin dose by 5 units at a time, twice per week, until you get to 80 units of Tresiba insulin   Adding Ozempic weekly injection  Start at 0.5 mg weekly  If you experience nausea, can reduce to 0.25 mg for 2-4 weeks then go back up to 0.5 mg   Plan to recheck A1C in 3 months  Plan to follow-up in the office sooner if you experience low sugars   Plan to follow-up in the office sooner if your fasting sugars are consistently high  Bring all sugar readings with you to your office visits

## 2019-06-20 NOTE — Progress Notes (Signed)
Clifford Strong is a 69 y.o. male who presents to  [place] at [facility]  today, 06/20/19, seeking care for the following:  The primary encounter diagnosis was Uncontrolled type 2 diabetes mellitus with hyperglycemia ([place]). Diagnoses of Hyperlipidemia associated with type 2 diabetes mellitus ([place]), Severe aortic stenosis, Hypertension associated with diabetes ([place]), and Peripheral edema were also pertinent to this visit.    ASSESSMENT & PLAN with other pertinent history/findings:  Uncontrolled diabetes.    Last visit with Evlyn Clines 11/21/2018 did note nonadherence to medication regimen and A1C increase from prior visit.  At that point patient was declining insulin therapy despite A1c of 13.2.  Today, A1c is greater than 14.  Last routine labs to monitor lipids, CBC, metabolic panel was 03/270 almost a year ago.   03/21/19 A1c greater than 14, advised patient that we would be starting insulin today and he is agreeable.  To stop his Trulicity and Janumet to simplify his medication regimen.  Today 06/20/19 12.8. Up to 60 units   Hypertension, not at goal  03/21/19, BP 185/109, rechecked thankfully better at 139/85 but still above goal.  He has not been taking the valsartan, we discussed restarting this medication.  Today 06/20/19: adding amlodipine vs hctz based on labs, if renal fxn ok would get hctz    BP Readings from Last 3 Encounters:  06/20/19 (!) 190/80  03/21/19 139/85  11/21/18 (!) 150/78    Orders Placed This Encounter  Procedures  . POCT HgB A1C   Results for orders placed or performed in visit on 06/20/19 (from the past 24 hour(s))  POCT HgB A1C     Status: Abnormal   Collection Time: 06/20/19  9:52 AM  Result Value Ref Range   Hemoglobin A1C 12.8 (A) 4.0 - 5.6 %   HbA1c POC (<> result, manual entry)     HbA1c, POC (prediabetic range)     HbA1c, POC (controlled diabetic range)       There are no Patient Instructions on  file for this visit.    Follow-up instructions: Return in about 1 week (around 06/27/2019) for nurse visit BP check, recheck A1C in 3 months in office .               BP (!) 190/80 (BP Location: Left Arm, Patient Position: Sitting, Cuff Size: Large)   Pulse 73   Wt 286 lb (129.7 kg)   SpO2 97%   BMI 43.49 kg/m   Current Meds  Medication Sig  . aspirin EC 81 MG tablet Take by mouth.  Marland Kitchen atorvastatin (LIPITOR) 40 MG tablet Take 1 tablet (40 mg total) by mouth daily.  . bictegravir-emtricitabine-tenofovir AF (BIKTARVY) 50-200-25 MG TABS tablet Take 1 tablet by mouth daily.  . carvedilol (COREG) 25 MG tablet TAKE 1 TABLET (25 MG TOTAL) BY MOUTH 2 (TWO) TIMES DAILY WITH A MEAL.  . Ergocalciferol 2000 units CAPS Take 1 capsule by mouth daily.  . furosemide (LASIX) 20 MG tablet TAKE 1 TABLET TWICE DAILY  . glucose blood test strip Use as instructed to check blood sugars 3 times daily DX E11.9 patient has one touch ultra mini  . Insulin Degludec (TRESIBA FLEXTOUCH) 200 UNIT/ML SOPN Inject 10-100 Units into the skin daily. Start at 10 units daily for 2 days, increase by 2 units at a time every 2 days until fasting sugars at goal of 100-150  . Insulin Pen Needle (BD PEN NEEDLE NANO U/F) 32G X 4 MM MISC  Use with Evaristo Bury to give insulin.  . Lancets (ONETOUCH ULTRASOFT) lancets Use as instructed to check blood sugars 3 times daily DX E11.9 patient has one touch ultra mini  . TRULICITY 1.5 MG/0.5ML SOPN   . valsartan (DIOVAN) 320 MG tablet TAKE 1 TABLET EVERY DAY    Results for orders placed or performed in visit on 06/20/19 (from the past 72 hour(s))  POCT HgB A1C     Status: Abnormal   Collection Time: 06/20/19  9:52 AM  Result Value Ref Range   Hemoglobin A1C 12.8 (A) 4.0 - 5.6 %   HbA1c POC (<> result, manual entry)     HbA1c, POC (prediabetic range)     HbA1c, POC (controlled diabetic range)      No results found.  Depression screen [facility] 2/9 04/04/2017 03/12/2017  Decreased  Interest 0 0  Down, Depressed, Hopeless 0 0  PHQ - 2 Score 0 0    No flowsheet data found.    All questions at time of visit were answered - patient instructed to contact office with any additional concerns or updates.  ER/RTC precautions were reviewed with the patient.  Please note: voice recognition software was used to produce this document, and typos may escape review. Please contact Dr. Lyn Hollingshead for any needed clarifications.

## 2019-06-21 LAB — COMPLETE METABOLIC PANEL WITH GFR
AG Ratio: 1.6 (calc) (ref 1.0–2.5)
ALT: 20 U/L (ref 9–46)
AST: 14 U/L (ref 10–35)
Albumin: 4.6 g/dL (ref 3.6–5.1)
Alkaline phosphatase (APISO): 81 U/L (ref 35–144)
BUN/Creatinine Ratio: 31 (calc) — ABNORMAL HIGH (ref 6–22)
BUN: 29 mg/dL — ABNORMAL HIGH (ref 7–25)
CO2: 29 mmol/L (ref 20–32)
Calcium: 10.1 mg/dL (ref 8.6–10.3)
Chloride: 99 mmol/L (ref 98–110)
Creat: 0.93 mg/dL (ref 0.70–1.25)
GFR, Est African American: 97 mL/min/{1.73_m2} (ref >=60)
GFR, Est Non African American: 83 mL/min/{1.73_m2} (ref >=60)
Globulin: 2.8 g/dL (calc) (ref 1.9–3.7)
Glucose, Bld: 260 mg/dL — ABNORMAL HIGH (ref 65–99)
Potassium: 4.3 mmol/L (ref 3.5–5.3)
Sodium: 136 mmol/L (ref 135–146)
Total Bilirubin: 0.7 mg/dL (ref 0.2–1.2)
Total Protein: 7.4 g/dL (ref 6.1–8.1)

## 2019-06-21 LAB — CBC
HCT: 43.3 % (ref 38.5–50.0)
Hemoglobin: 14.7 g/dL (ref 13.2–17.1)
MCH: 30.9 pg (ref 27.0–33.0)
MCHC: 33.9 g/dL (ref 32.0–36.0)
MCV: 91 fL (ref 80.0–100.0)
MPV: 11.4 fL (ref 7.5–12.5)
Platelets: 140 10*3/uL (ref 140–400)
RBC: 4.76 10*6/uL (ref 4.20–5.80)
RDW: 12.1 % (ref 11.0–15.0)
WBC: 6.6 10*3/uL (ref 3.8–10.8)

## 2019-06-25 ENCOUNTER — Other Ambulatory Visit: Payer: Self-pay

## 2019-06-25 ENCOUNTER — Ambulatory Visit (INDEPENDENT_AMBULATORY_CARE_PROVIDER_SITE_OTHER): Payer: Medicare Other | Admitting: Osteopathic Medicine

## 2019-06-25 VITALS — BP 127/70 | HR 64 | Temp 97.6°F

## 2019-06-25 DIAGNOSIS — I10 Essential (primary) hypertension: Secondary | ICD-10-CM

## 2019-06-25 NOTE — Progress Notes (Signed)
Patient presents today as a nurse visit for a blood pressure check.  Patient states he  is taking his medication as prescribed without any side effects/adverse effects. Medication and allergy list reviewed with patient and the pharmacy has been verified.   HA: No Dizziness/lightheadedness: No Fever: No BA: No Weakness/Fatigue: No  Sinus pain/pressure: No  Runny nose: No  ST: No  ShOB: No  CP: No  Palps: No Abd pain: No Dysuria: No  N/V/C/D: No    Vital Signs: VS @ 1342 Blood Pressure: 168/79 Pulse: 64 SpO2: 99%  VS @ 1359 Blood Pressure: 127/70  Patient states he has been checking his BP at home but he does not have his readings with him today. I requested that he send a MyChart message when he gets home to let us know what those numbers have been.   Vitals reviewed with Dr. Lyn Hollingshead. Patient informed per her verbal instruction: no change in treatment at this time. Continue to monitor BP at home and keep a log of readings and bring that log with you at your next appointment.   Patient verbalized understanding. No further questions or concerns at this time.

## 2019-07-07 DIAGNOSIS — I350 Nonrheumatic aortic (valve) stenosis: Secondary | ICD-10-CM | POA: Diagnosis not present

## 2019-07-07 DIAGNOSIS — Z952 Presence of prosthetic heart valve: Secondary | ICD-10-CM | POA: Diagnosis not present

## 2019-07-07 DIAGNOSIS — E118 Type 2 diabetes mellitus with unspecified complications: Secondary | ICD-10-CM | POA: Diagnosis not present

## 2019-07-07 DIAGNOSIS — R0602 Shortness of breath: Secondary | ICD-10-CM | POA: Diagnosis not present

## 2019-07-07 DIAGNOSIS — I10 Essential (primary) hypertension: Secondary | ICD-10-CM | POA: Diagnosis not present

## 2019-07-11 DIAGNOSIS — R0602 Shortness of breath: Secondary | ICD-10-CM | POA: Diagnosis not present

## 2019-07-11 DIAGNOSIS — Z952 Presence of prosthetic heart valve: Secondary | ICD-10-CM | POA: Diagnosis not present

## 2019-09-07 ENCOUNTER — Other Ambulatory Visit: Payer: Self-pay | Admitting: Osteopathic Medicine

## 2019-09-07 DIAGNOSIS — I350 Nonrheumatic aortic (valve) stenosis: Secondary | ICD-10-CM

## 2019-10-10 DIAGNOSIS — Z952 Presence of prosthetic heart valve: Secondary | ICD-10-CM | POA: Diagnosis not present

## 2019-10-10 DIAGNOSIS — I10 Essential (primary) hypertension: Secondary | ICD-10-CM | POA: Diagnosis not present

## 2019-10-13 ENCOUNTER — Encounter: Payer: Self-pay | Admitting: Osteopathic Medicine

## 2019-10-13 DIAGNOSIS — Z952 Presence of prosthetic heart valve: Secondary | ICD-10-CM | POA: Diagnosis not present

## 2019-10-13 DIAGNOSIS — J8410 Pulmonary fibrosis, unspecified: Secondary | ICD-10-CM | POA: Diagnosis not present

## 2019-10-17 DIAGNOSIS — Z952 Presence of prosthetic heart valve: Secondary | ICD-10-CM | POA: Diagnosis not present

## 2019-10-17 DIAGNOSIS — Z01812 Encounter for preprocedural laboratory examination: Secondary | ICD-10-CM | POA: Diagnosis not present

## 2019-10-17 DIAGNOSIS — E669 Obesity, unspecified: Secondary | ICD-10-CM | POA: Diagnosis not present

## 2019-10-17 DIAGNOSIS — R011 Cardiac murmur, unspecified: Secondary | ICD-10-CM | POA: Diagnosis not present

## 2019-10-17 DIAGNOSIS — Z20822 Contact with and (suspected) exposure to covid-19: Secondary | ICD-10-CM | POA: Diagnosis not present

## 2019-10-17 DIAGNOSIS — R600 Localized edema: Secondary | ICD-10-CM | POA: Diagnosis not present

## 2019-10-17 DIAGNOSIS — R739 Hyperglycemia, unspecified: Secondary | ICD-10-CM | POA: Diagnosis not present

## 2019-10-18 DIAGNOSIS — Z7982 Long term (current) use of aspirin: Secondary | ICD-10-CM | POA: Diagnosis not present

## 2019-10-18 DIAGNOSIS — Z888 Allergy status to other drugs, medicaments and biological substances status: Secondary | ICD-10-CM | POA: Diagnosis not present

## 2019-10-18 DIAGNOSIS — Z79899 Other long term (current) drug therapy: Secondary | ICD-10-CM | POA: Diagnosis not present

## 2019-10-18 DIAGNOSIS — I10 Essential (primary) hypertension: Secondary | ICD-10-CM | POA: Diagnosis not present

## 2019-10-18 DIAGNOSIS — Z7984 Long term (current) use of oral hypoglycemic drugs: Secondary | ICD-10-CM | POA: Diagnosis not present

## 2019-10-18 DIAGNOSIS — R0602 Shortness of breath: Secondary | ICD-10-CM | POA: Diagnosis not present

## 2019-10-18 DIAGNOSIS — E669 Obesity, unspecified: Secondary | ICD-10-CM | POA: Diagnosis not present

## 2019-10-18 DIAGNOSIS — E1165 Type 2 diabetes mellitus with hyperglycemia: Secondary | ICD-10-CM | POA: Diagnosis not present

## 2019-10-18 DIAGNOSIS — E785 Hyperlipidemia, unspecified: Secondary | ICD-10-CM | POA: Diagnosis not present

## 2019-10-18 DIAGNOSIS — Z954 Presence of other heart-valve replacement: Secondary | ICD-10-CM | POA: Diagnosis not present

## 2019-10-27 ENCOUNTER — Other Ambulatory Visit: Payer: Self-pay

## 2019-10-27 ENCOUNTER — Ambulatory Visit (INDEPENDENT_AMBULATORY_CARE_PROVIDER_SITE_OTHER): Payer: BC Managed Care – PPO | Admitting: Osteopathic Medicine

## 2019-10-27 ENCOUNTER — Encounter: Payer: Self-pay | Admitting: Osteopathic Medicine

## 2019-10-27 VITALS — BP 104/71 | HR 81 | Wt 272.0 lb

## 2019-10-27 DIAGNOSIS — E1165 Type 2 diabetes mellitus with hyperglycemia: Secondary | ICD-10-CM

## 2019-10-27 LAB — POCT GLYCOSYLATED HEMOGLOBIN (HGB A1C)
HbA1c POC (<> result, manual entry): 14 % (ref 4.0–5.6)
HbA1c, POC (controlled diabetic range): 14 % — AB (ref 0.0–7.0)
HbA1c, POC (prediabetic range): 14 % — AB (ref 5.7–6.4)
Hemoglobin A1C: 14 % — AB (ref 4.0–5.6)

## 2019-10-27 MED ORDER — TRESIBA FLEXTOUCH 200 UNIT/ML ~~LOC~~ SOPN: 10.0000 [IU] | PEN_INJECTOR | Freq: Every day | SUBCUTANEOUS | 11 refills | Status: DC

## 2019-10-27 NOTE — Progress Notes (Signed)
HPI: Clifford Strong is a 69 y.o. male who  has a past medical history of Aortic stenosis, Degenerative joint disease (DJD) of hip (04/09/2017), Diabetes mellitus without complication (HCC), Heart murmur, HIV infection (HCC), Hypertension, and Obesity.  he presents to [facility] today, 10/27/19,  for chief complaint of: ER follow up, hyperglycemia  Reviewed ER records. Patient reports he was seen by his cardiologist for exertional dyspnea, and was going to have a TEE, but his preop labs on 8/13 showed a glucose of >500, so he was sent to the ED. He was waiting in the ER waiting room for 2 hours before they brought in a patient who was coughing. His daughter was concerned she might have COVID, and since he immunocompromised, they left and returned the next morning. At that time his glucose was 544. He was given IV fluids and IV insulin, which reduced his sugar to <300 and he was told to follow up with his PCP.  When discussing his medications, patient states he has been taking ozempic once per week, but sometimes forgets and waits until the next week to take it. When he started the ozempic, he thought this would replace his insulin, so he has not taken insulin since April 2021. (insulin was NOT stopped - see printed AVS from last visit...)   Currently, he denies any symptoms, including polyuria, polydipsia, headache, vision change, chest pain, abdominal pain, nausea, vomiting.   Patient is accompanied by his daughter who assists with history-taking.   Previous visits:  Last visit with Vinetta Bergamo 11/21/2018 did note nonadherence to medication regimen and A1C increase from prior visit.  At that point patient was declining insulin therapy despite A1c of 13.2.  Today, A1c is greater than 14.  Last routine labs to monitor lipids, CBC, metabolic panel was 04/2018 almost a year ago.   03/21/19 A1c greater than 14, advised patient that we would be starting insulin today and he  is agreeable.  To stop his Trulicity and Janumet to simplify his medication regimen.  06/20/19 A1c 12.8. Increase insulin to 80 units over next 2 weeks. Add ozempic 0.5mg  weekly.  Past medical, surgical, social and family history reviewed:  Patient Active Problem List   Diagnosis Date Noted  . Severe aortic stenosis 12/15/2018  . Hyponatremia 12/15/2018  . Nonrheumatic aortic valve stenosis 05/03/2018  . Diastolic dysfunction without heart failure 11/26/2017  . Uncontrolled type 2 diabetes mellitus with hyperglycemia (HCC) 11/02/2017  . Dyspnea on exertion 11/02/2017  . Peripheral edema 11/02/2017  . At risk for obstructive sleep apnea 04/16/2017  . Degenerative joint disease (DJD) of hip 04/09/2017  . Vitamin D deficiency 04/04/2017  . Encounter for monitoring statin therapy 04/04/2017  . Onychomycosis of multiple toenails with type 2 diabetes mellitus (HCC) 04/04/2017  . Hypertension associated with diabetes (HCC) 04/04/2017  . Class 3 severe obesity due to excess calories with serious comorbidity and body mass index (BMI) of 40.0 to 44.9 in adult (HCC) 04/04/2017  . Colon cancer screening 04/04/2017  . Comprehensive diabetic foot examination, type 2 DM, encounter for (HCC) 04/04/2017  . Hyperlipidemia associated with type 2 diabetes mellitus (HCC) 03/12/2017  . Status post aortic valve repair 03/12/2017  . Controlled type 2 diabetes mellitus with microalbuminuria, without long-term current use of insulin (HCC) 03/12/2017  . Dyslipidemia associated with type 2 diabetes mellitus (HCC) 03/12/2017  . HIV disease (HCC) 03/12/2017  . Anemia 04/28/2015  . Microalbuminuria due to type 2 diabetes mellitus (HCC) 04/28/2015  .  CMV (cytomegalovirus) antibody positive 04/06/2015  . Urinary calculus, unspecified 02/08/2009  . Nocturia 08/07/2007    Past Surgical History:  Procedure Laterality Date  . AORTIC VALVE REPLACEMENT  2014    Social History   Tobacco Use  . Smoking status:  Never Smoker  . Smokeless tobacco: Never Used  Substance Use Topics  . Alcohol use: No    Family History  Problem Relation Age of Onset  . Diabetes Mother   . Hypertension Mother   . Heart attack Mother   . Diabetes Father   . Hypertension Father   . Lung disease Father      Current medication list and allergy/intolerance information reviewed:    Current Outpatient Medications  Medication Sig Dispense Refill  . aspirin EC 81 MG tablet Take 1 tablet (81 mg total) by mouth daily. 90 tablet 3  . atorvastatin (LIPITOR) 40 MG tablet Take 1 tablet (40 mg total) by mouth daily. 90 tablet 1  . bictegravir-emtricitabine-tenofovir AF (BIKTARVY) 50-200-25 MG TABS tablet Take 1 tablet by mouth daily. 90 tablet 3  . carvedilol (COREG) 25 MG tablet TAKE 1 TABLET (25 MG TOTAL) BY MOUTH 2 (TWO) TIMES DAILY WITH A MEAL. 180 tablet 1  . Ergocalciferol 2000 units CAPS Take 1 capsule by mouth daily.    . furosemide (LASIX) 20 MG tablet Take 1 tablet (20 mg total) by mouth 2 (two) times daily. 180 tablet 1  . glucose blood test strip Use as instructed to check blood sugars 3 times daily DX E11.9 patient has one touch ultra mini 100 each 12  . insulin degludec (TRESIBA FLEXTOUCH) 200 UNIT/ML FlexTouch Pen Inject 10-100 Units into the skin daily. Start at 20 units daily for 2 days, increase by 4 units at a time every 2 days until fasting sugars at goal of 100-150 30 mL 11  . Insulin Pen Needle (BD PEN NEEDLE NANO U/F) 32G X 4 MM MISC Use with Tresiba to give insulin. 100 each 12  . Lancets (ONETOUCH ULTRASOFT) lancets Use as instructed to check blood sugars 3 times daily DX E11.9 patient has one touch ultra mini 100 each 12  . Semaglutide,0.25 or 0.5MG /DOS, (OZEMPIC, 0.25 OR 0.5 MG/DOSE,) 2 MG/1.5ML SOPN Inject 0.5 mg into the skin once a week. 4 pen 3  . valsartan (DIOVAN) 320 MG tablet Take 1 tablet (320 mg total) by mouth daily. 90 tablet 1   No current facility-administered medications for this visit.     Allergies  Allergen Reactions  . Lisinopril Cough      Review of Systems:  Constitutional:  No  fever, no chills.  HEENT: No  headache, no vision change.  Cardiac: No  chest pain  Respiratory:  + Exertional  shortness of breath. No  Cough  Gastrointestinal: No  abdominal pain, No  nausea, No  Vomiting.  Musculoskeletal: No new myalgia/arthralgia  Skin: No  Rash, No other wounds/concerning lesions  Endocrine: No cold intolerance,  No heat intolerance. No polyuria/polydipsia/polyphagia   Neurologic: No  weakness, No  dizziness  Psychiatric: No  concerns with depression  Exam:  BP 104/71 (BP Location: Left Arm, Patient Position: Sitting)   Pulse 81   Wt 272 lb (123.4 kg)   SpO2 97%   BMI 41.36 kg/m   Constitutional: VS see above. General Appearance: alert, well-developed, well-nourished, NAD  Eyes: Normal lids and conjunctive, non-icteric sclera  Neck: No masses, trachea midline.  Respiratory: Normal respiratory effort. no wheeze, no rhonchi, no rales  Cardiovascular: S1/S2  normal, no murmur, no rub/gallop auscultated. RRR.  Gastrointestinal: Nontender, no masses.   Musculoskeletal: Gait normal. No clubbing/cyanosis of digits.   Neurological: Normal balance/coordination. No tremor. No cranial nerve deficit on limited exam.   Skin: warm, dry, intact. No rash/ulcer. No concerning nevi or subq nodules on limited exam.   Psychiatric: Normal judgment/insight. Normal mood and affect. Oriented x3.    Results for orders placed or performed in visit on 10/27/19 (from the past 72 hour(s))  POCT HgB A1C     Status: Abnormal   Collection Time: 10/27/19 11:10 AM  Result Value Ref Range   Hemoglobin A1C 14.0 (A) 4.0 - 5.6 %   HbA1c POC (<> result, manual entry) 14.0 4.0 - 5.6 %   HbA1c, POC (prediabetic range) 14.0 (A) 5.7 - 6.4 %   HbA1c, POC (controlled diabetic range) 14.0 (A) 0.0 - 7.0 %    No results found.      ASSESSMENT/PLAN: The encounter  diagnosis was Uncontrolled type 2 diabetes mellitus with hyperglycemia (HCC).   Uncontrolled Type 2 Diabetes Mellitus  Pt was not clear on medication instructions with regards to insulin, so he has not taken it since 06/2019. AM sugars have been 330s. A1c today 14.  Restarting insulin. Start at 16 units and increase by 4 units every 2 days until AM glucose 100-150. Gave patient a calendar to track insulin dosage, ozempic doses, and fasting glucoses.  Follow up in 2 weeks to re-evaluate - bring calendar to visit. Recheck A1c in 3 months.  Patient is following with cardiology with regards to exertional dyspnea.   Orders Placed This Encounter  Procedures  . POCT HgB A1C    Meds ordered this encounter  Medications  . insulin degludec (TRESIBA FLEXTOUCH) 200 UNIT/ML FlexTouch Pen    Sig: Inject 10-100 Units into the skin daily. Start at 20 units daily for 2 days, increase by 4 units at a time every 2 days until fasting sugars at goal of 100-150    Dispense:  30 mL    Refill:  11    Patient Instructions  For Diabetes  Measure fasting blood sugar every day: goal for now is to get this to 100-150  Start Tresiba back at 165 units per day in the evening. Increase daily Insulin dose by 4 units at a time, every 2 days,  until you get to 100 units of Tresiba insulin   Continue Ozempic weekly injection in addition to the insulin  ? Continue at 0.5 mg weekly ? If you forget to take a dose, take it whenever you remember and then continue every week after that   Plan to recheck A1C in 3 months ? Plan to follow-up in the office sooner if you experience low sugars  ? Plan to follow-up in the office sooner if your fasting sugars are consistently high  Bring all sugar readings with you to your office visits          Visit summary with medication list and pertinent instructions was printed for patient to review. All questions at time of visit were answered - patient instructed to contact  office with any additional concerns or updates. ER/RTC precautions were reviewed with the patient.   Please note: voice recognition software was used to produce this document, and typos may escape review. Please contact Dr. Lyn Hollingshead for any needed clarifications.     Follow-up plan: Return in about 2 weeks (around 11/10/2019) for review sugars and medications w/ Dr Lyn Hollingshead .  Jill Side  Brigitte PulseBonner, [employer] MS3

## 2019-10-27 NOTE — Patient Instructions (Addendum)
For Diabetes  Measure fasting blood sugar every day: goal for now is to get this to 100-150  Start Tresiba back at 165 units per day in the evening. Increase daily Insulin dose by 4 units at a time, every 2 days,  until you get to 100 units of Tresiba insulin   Continue Ozempic weekly injection in addition to the insulin  ? Continue at 0.5 mg weekly ? If you forget to take a dose, take it whenever you remember and then continue every week after that   Plan to recheck A1C in 3 months ? Plan to follow-up in the office sooner if you experience low sugars  ? Plan to follow-up in the office sooner if your fasting sugars are consistently high  Bring all sugar readings with you to your office visits

## 2019-11-03 ENCOUNTER — Other Ambulatory Visit: Payer: Self-pay

## 2019-11-03 ENCOUNTER — Encounter: Payer: Self-pay | Admitting: Osteopathic Medicine

## 2019-11-03 DIAGNOSIS — E1165 Type 2 diabetes mellitus with hyperglycemia: Secondary | ICD-10-CM

## 2019-11-03 MED ORDER — TRESIBA FLEXTOUCH 200 UNIT/ML ~~LOC~~ SOPN: 10.0000 [IU] | PEN_INJECTOR | Freq: Every day | SUBCUTANEOUS | 11 refills | Status: DC

## 2019-11-03 MED ORDER — OZEMPIC (0.25 OR 0.5 MG/DOSE) 2 MG/1.5ML ~~LOC~~ SOPN: 0.5000 mg | PEN_INJECTOR | SUBCUTANEOUS | 3 refills | Status: DC

## 2019-11-03 MED ORDER — BD PEN NEEDLE NANO U/F 32G X 4 MM MISC: 12 refills | Status: DC

## 2019-11-05 ENCOUNTER — Other Ambulatory Visit: Payer: Self-pay

## 2019-11-05 DIAGNOSIS — E1165 Type 2 diabetes mellitus with hyperglycemia: Secondary | ICD-10-CM

## 2019-11-05 MED ORDER — OZEMPIC (0.25 OR 0.5 MG/DOSE) 2 MG/1.5ML ~~LOC~~ SOPN: 0.5000 mg | PEN_INJECTOR | SUBCUTANEOUS | 3 refills | Status: DC

## 2019-11-12 ENCOUNTER — Ambulatory Visit (INDEPENDENT_AMBULATORY_CARE_PROVIDER_SITE_OTHER): Payer: BC Managed Care – PPO | Admitting: Osteopathic Medicine

## 2019-11-12 ENCOUNTER — Encounter: Payer: Self-pay | Admitting: Osteopathic Medicine

## 2019-11-12 VITALS — BP 135/77 | HR 75 | Wt 272.0 lb

## 2019-11-12 DIAGNOSIS — E1165 Type 2 diabetes mellitus with hyperglycemia: Secondary | ICD-10-CM

## 2019-11-12 NOTE — Progress Notes (Signed)
Clifford Strong is a 69 y.o. male who presents to  [facility] at [facility]  today, 11/12/19, seeking care for the following: Follow up diabetes   Today 11/12/19 brings calendar w/ Glc readings - improved! Titrating up on inulin without hypoglycemia or other adverse effects. Fasting Glc not at goal yet   Previous visits:  10/27/19 ER follow-up for hyperglycemia, he's stopped the insulin and was just on the Ozempic and that Rx was not taken consistently. We restarted insulin to titrate up. Was given calendar and written instructions to track his doses, fasting Glc, days he administers the Ozempic   04/16/21A1c 12.8. Increase insulin to 80 units over next 2 weeks. Add ozempic 0.5mg  weekly.  03/21/19 A1c greater than 14, advised patient that we would be starting insulin today and he is agreeable. To stop his Trulicity and Janumet to simplify his medication regimen  Last visit with Vinetta Bergamo 9/17/2020did note nonadherence to medication regimen and A1C increase from prior visit. At that point patient was declining insulin therapy despite A1c of 13.2. Today, A1c is greater than 14.     ASSESSMENT & PLAN with other pertinent findings:  The encounter diagnosis was Uncontrolled type 2 diabetes mellitus with hyperglycemia (HCC).   Doing well Continue titration up Given compliance issues in the past will call him next week to check on Glc - reminder set in Epic     Follow-up instructions: Return in about 3 months (around 02/11/2020) for RECHECK A1C.                                         BP 135/77 (BP Location: Left Arm, Patient Position: Sitting)   Pulse 75   Wt 272 lb (123.4 kg)   SpO2 98%   BMI 41.36 kg/m   Current Meds  Medication Sig  . aspirin EC 81 MG tablet Take 1 tablet (81 mg total) by mouth daily.  Marland Kitchen atorvastatin (LIPITOR) 40 MG tablet Take 1 tablet (40 mg total) by mouth daily.  .  bictegravir-emtricitabine-tenofovir AF (BIKTARVY) 50-200-25 MG TABS tablet Take 1 tablet by mouth daily.  . carvedilol (COREG) 25 MG tablet TAKE 1 TABLET (25 MG TOTAL) BY MOUTH 2 (TWO) TIMES DAILY WITH A MEAL.  . Ergocalciferol 2000 units CAPS Take 1 capsule by mouth daily.  . furosemide (LASIX) 20 MG tablet Take 1 tablet (20 mg total) by mouth 2 (two) times daily.  Marland Kitchen glucose blood test strip Use as instructed to check blood sugars 3 times daily DX E11.9 patient has one touch ultra mini  . insulin degludec (TRESIBA FLEXTOUCH) 200 UNIT/ML FlexTouch Pen Inject 10-100 Units into the skin daily. Start at 20 units daily for 2 days, increase by 4 units at a time every 2 days until fasting sugars at goal of 100-150  . Insulin Pen Needle (BD PEN NEEDLE NANO U/F) 32G X 4 MM MISC Use with Tresiba to give insulin.  . Lancets (ONETOUCH ULTRASOFT) lancets Use as instructed to check blood sugars 3 times daily DX E11.9 patient has one touch ultra mini  . Semaglutide,0.25 or 0.5MG /DOS, (OZEMPIC, 0.25 OR 0.5 MG/DOSE,) 2 MG/1.5ML SOPN Inject 0.375 mLs (0.5 mg total) into the skin once a week.  . valsartan (DIOVAN) 320 MG tablet Take 1 tablet (320 mg total) by mouth daily.    No results found for this or any previous visit (from  the past 72 hour(s)).  No results found.     All questions at time of visit were answered - patient instructed to contact office with any additional concerns or updates.  ER/RTC precautions were reviewed with the patient as applicable.   Please note: voice recognition software was used to produce this document, and typos may escape review. Please contact Dr. Sheppard Coil for any needed clarifications.

## 2019-11-20 ENCOUNTER — Telehealth: Payer: Self-pay | Admitting: Osteopathic Medicine

## 2019-11-20 NOTE — Telephone Encounter (Signed)
-----   Message from Sunnie Nielsen, DO sent at 11/12/2019  4:55 PM EDT ----- Please call patient How many units insulin is he on now? What was most recent fasting glucose? Thanks!

## 2019-11-21 DIAGNOSIS — Z952 Presence of prosthetic heart valve: Secondary | ICD-10-CM | POA: Diagnosis not present

## 2019-11-21 DIAGNOSIS — E118 Type 2 diabetes mellitus with unspecified complications: Secondary | ICD-10-CM | POA: Diagnosis not present

## 2019-11-21 DIAGNOSIS — B20 Human immunodeficiency virus [HIV] disease: Secondary | ICD-10-CM | POA: Diagnosis not present

## 2019-11-21 DIAGNOSIS — I10 Essential (primary) hypertension: Secondary | ICD-10-CM | POA: Diagnosis not present

## 2019-11-21 DIAGNOSIS — E785 Hyperlipidemia, unspecified: Secondary | ICD-10-CM | POA: Diagnosis not present

## 2019-11-25 NOTE — Telephone Encounter (Signed)
Patient notified, left detailed message on patient's voicemail advising him we needed insulin unit dosage and last fasting glucose.

## 2019-11-27 ENCOUNTER — Encounter: Payer: Self-pay | Admitting: Osteopathic Medicine

## 2019-11-27 NOTE — Telephone Encounter (Signed)
Pt sent two msgs via MyChart. Routed to covering provider (Dr. Ashley Royalty) for recommendation.

## 2019-12-11 DIAGNOSIS — E118 Type 2 diabetes mellitus with unspecified complications: Secondary | ICD-10-CM | POA: Diagnosis not present

## 2019-12-11 DIAGNOSIS — Z952 Presence of prosthetic heart valve: Secondary | ICD-10-CM | POA: Diagnosis not present

## 2019-12-11 DIAGNOSIS — I10 Essential (primary) hypertension: Secondary | ICD-10-CM | POA: Diagnosis not present

## 2020-02-02 ENCOUNTER — Other Ambulatory Visit: Payer: Self-pay | Admitting: Osteopathic Medicine

## 2020-02-13 ENCOUNTER — Ambulatory Visit: Payer: BC Managed Care – PPO | Admitting: Osteopathic Medicine

## 2020-02-20 ENCOUNTER — Ambulatory Visit: Payer: BC Managed Care – PPO | Admitting: Osteopathic Medicine

## 2020-03-01 IMAGING — DX DG CHEST 2V
2 series · 2 of 2 positions shown · non-contrast
Comparison: No priors.

CLINICAL DATA: 66-year-old male with history of wheezing for 1
month.

EXAM:
CHEST - 2 VIEW

[chest pa]
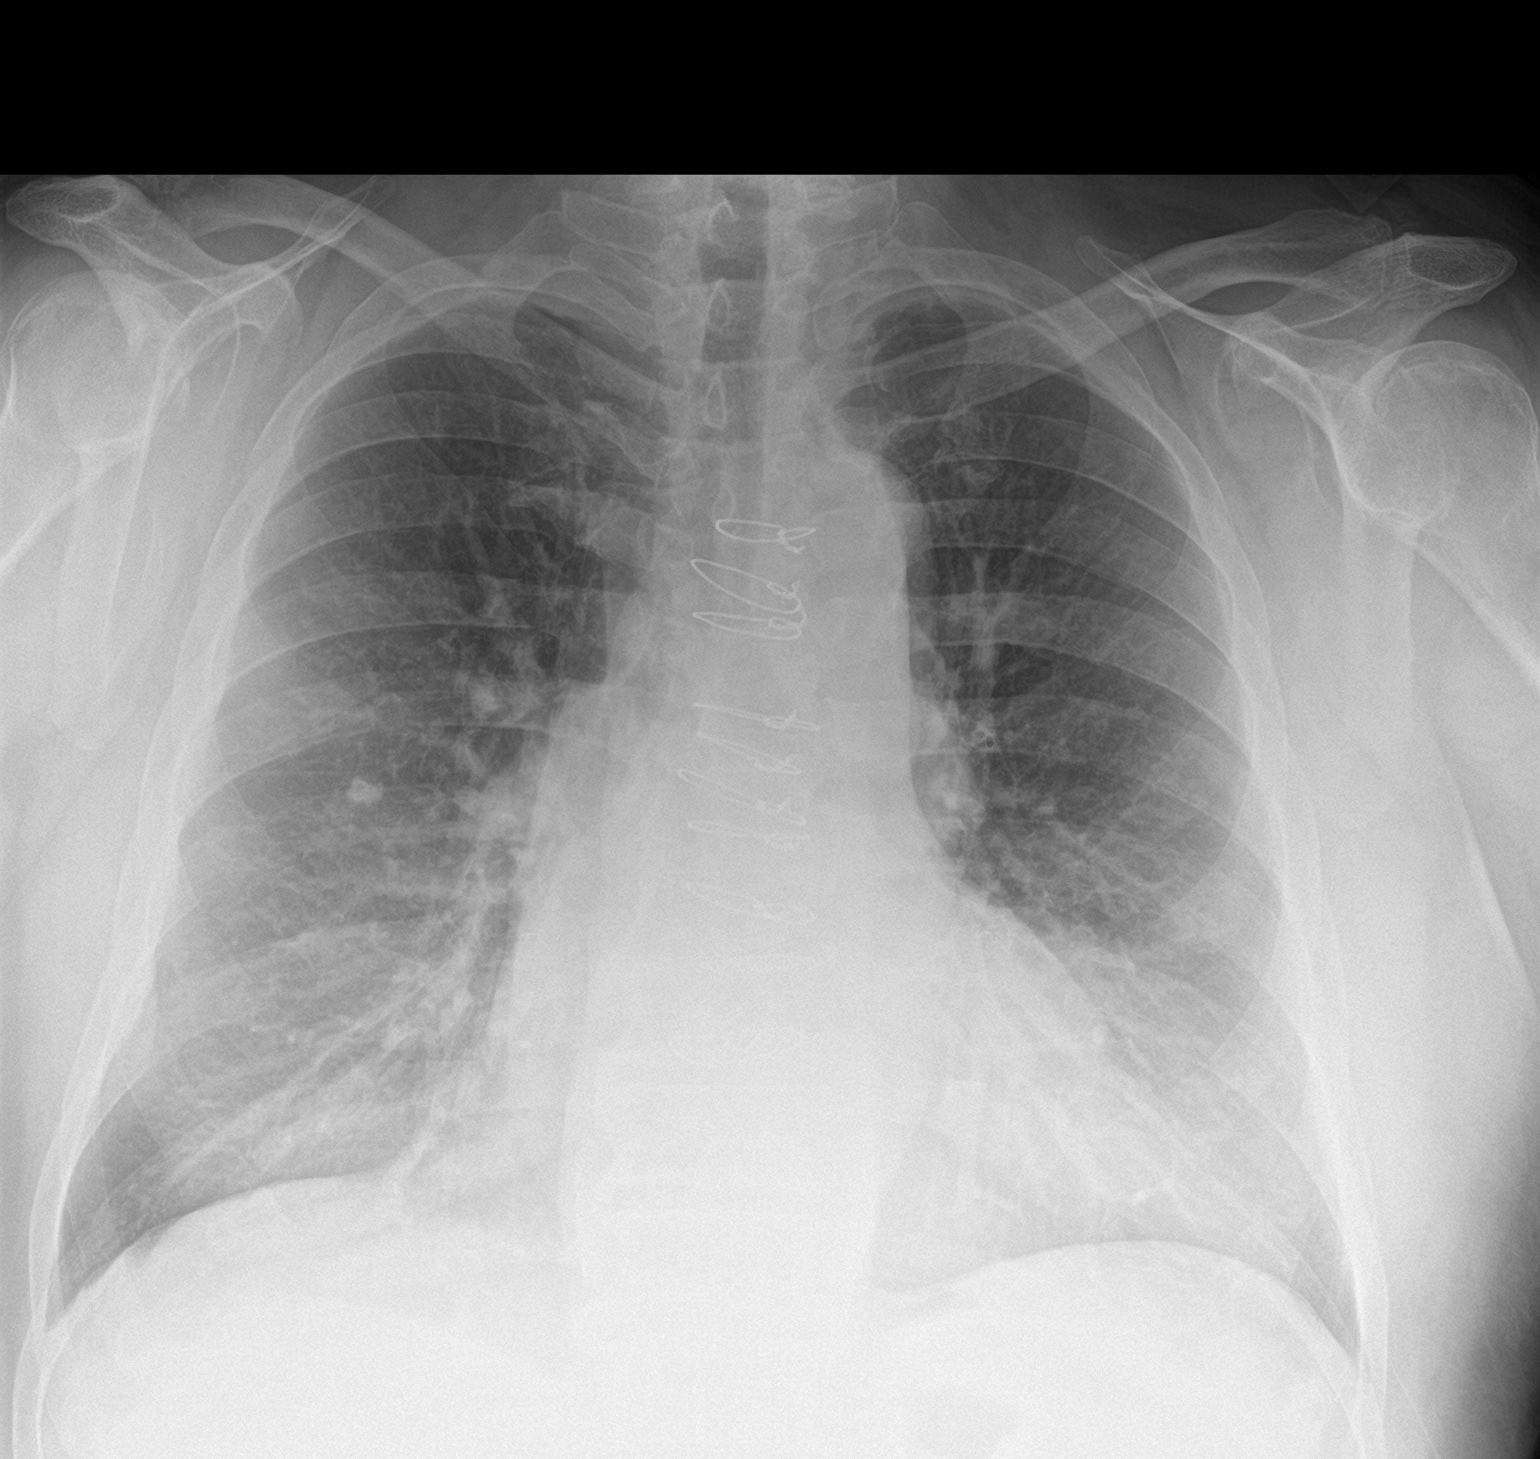

[chest lat]
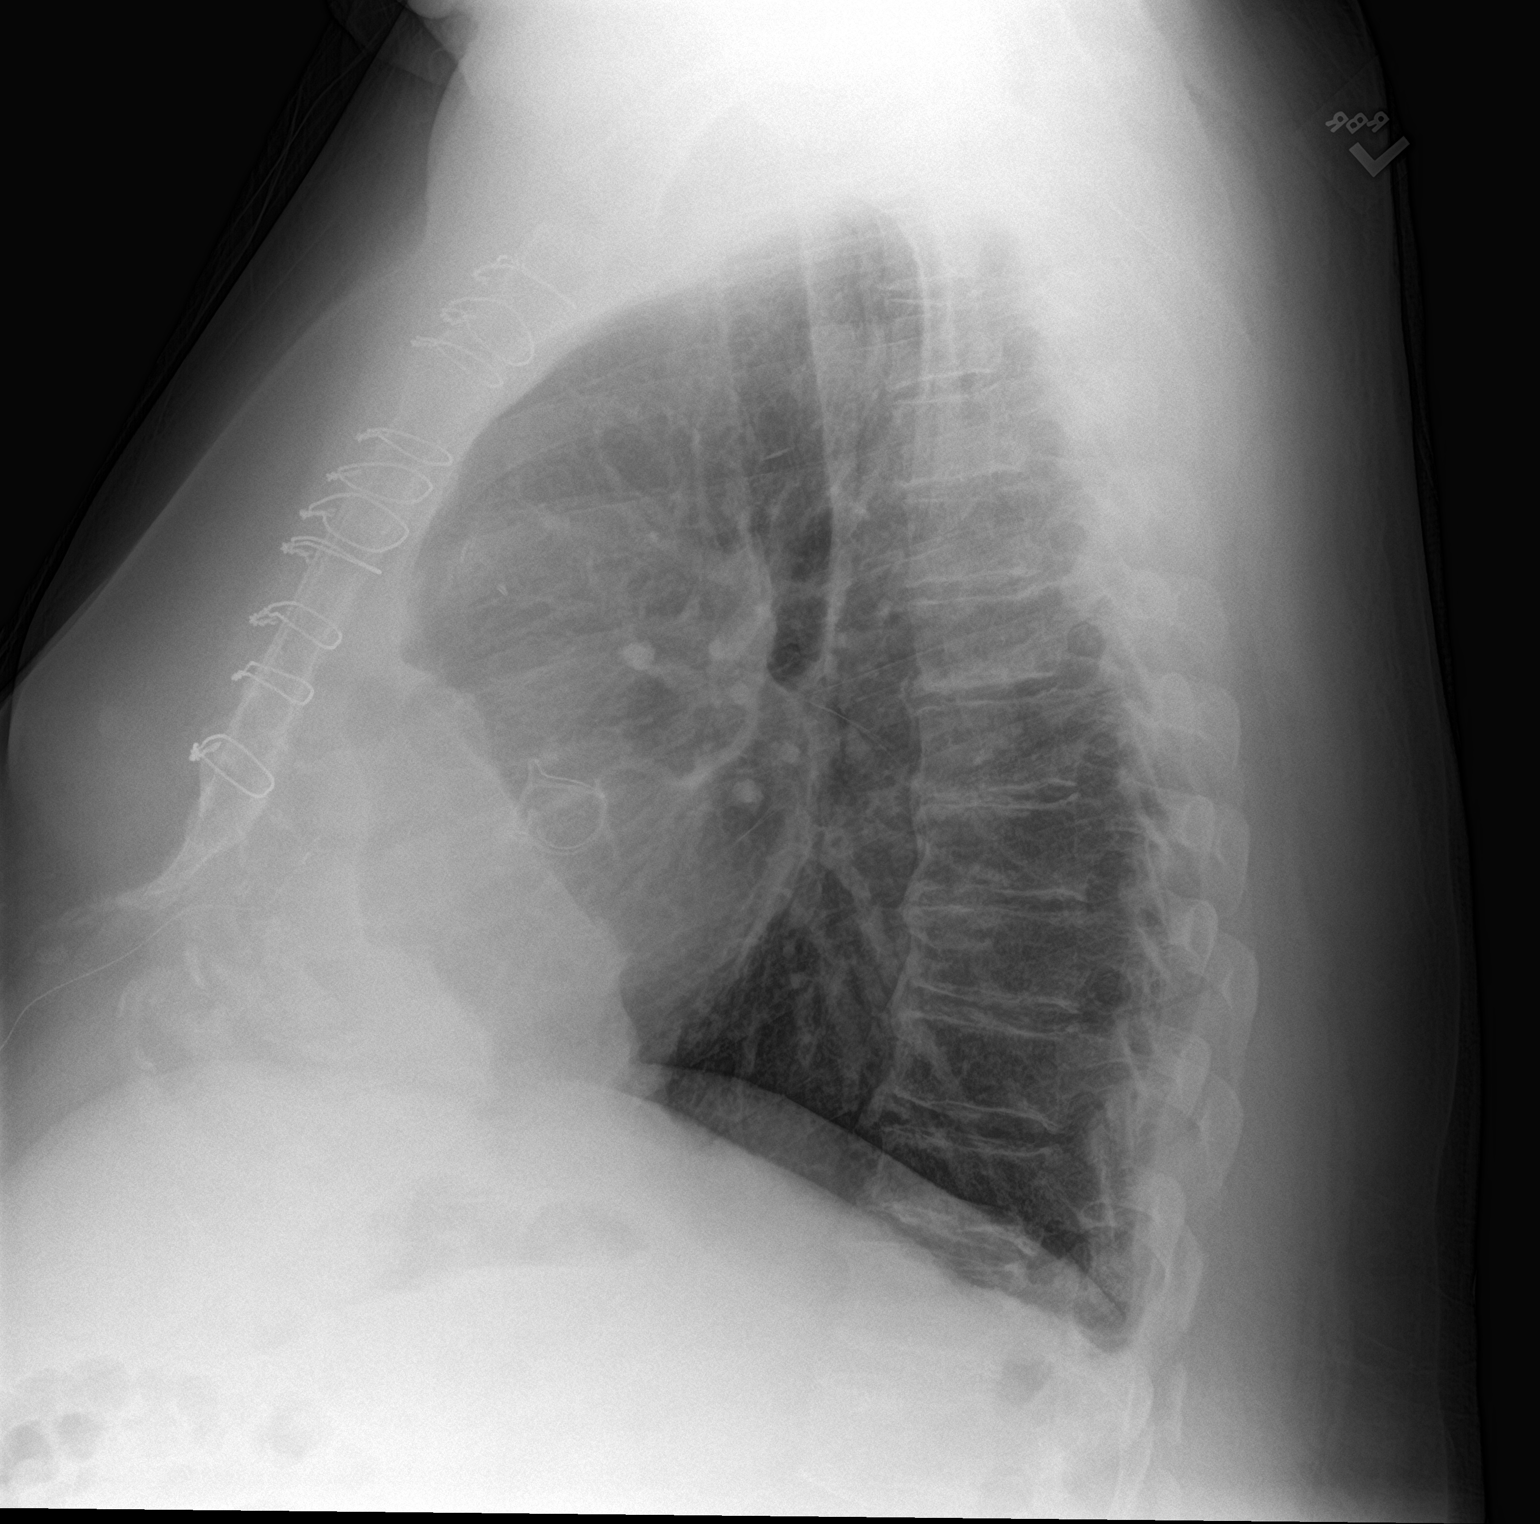

[2 of 2 positions shown; findings below may reference images not displayed]

FINDINGS: Mild diffuse peribronchial cuffing. Lung volumes are normal. No
consolidative airspace disease. No pleural effusions. No
pneumothorax. 1 cm nodule projecting in the right mid lung.
Pulmonary vasculature and the cardiomediastinal silhouette are
within normal limits. Atherosclerosis in the thoracic aorta. Status
post median sternotomy for aortic valve replacement.
IMPRESSION: 1. Diffuse peribronchial cuffing, suggestive of an acute bronchitis.
2. 1 cm nodular density projecting in the mid right lung, either in
the superior aspect of the right middle lobe or inferior aspect of
the right upper lobe. Further evaluation with nonemergent chest CT
is suggested in the near future to better evaluate this finding.

## 2020-03-20 DIAGNOSIS — E785 Hyperlipidemia, unspecified: Secondary | ICD-10-CM | POA: Diagnosis present

## 2020-03-20 DIAGNOSIS — B20 Human immunodeficiency virus [HIV] disease: Secondary | ICD-10-CM | POA: Diagnosis present

## 2020-03-20 DIAGNOSIS — J1282 Pneumonia due to coronavirus disease 2019: Secondary | ICD-10-CM | POA: Diagnosis not present

## 2020-03-20 DIAGNOSIS — D696 Thrombocytopenia, unspecified: Secondary | ICD-10-CM | POA: Diagnosis present

## 2020-03-20 DIAGNOSIS — Z794 Long term (current) use of insulin: Secondary | ICD-10-CM | POA: Diagnosis not present

## 2020-03-20 DIAGNOSIS — E1165 Type 2 diabetes mellitus with hyperglycemia: Secondary | ICD-10-CM | POA: Diagnosis not present

## 2020-03-20 DIAGNOSIS — Z7982 Long term (current) use of aspirin: Secondary | ICD-10-CM | POA: Diagnosis not present

## 2020-03-20 DIAGNOSIS — Z8249 Family history of ischemic heart disease and other diseases of the circulatory system: Secondary | ICD-10-CM | POA: Diagnosis not present

## 2020-03-20 DIAGNOSIS — Z23 Encounter for immunization: Secondary | ICD-10-CM | POA: Diagnosis not present

## 2020-03-20 DIAGNOSIS — I350 Nonrheumatic aortic (valve) stenosis: Secondary | ICD-10-CM | POA: Diagnosis present

## 2020-03-20 DIAGNOSIS — Z7984 Long term (current) use of oral hypoglycemic drugs: Secondary | ICD-10-CM | POA: Diagnosis not present

## 2020-03-20 DIAGNOSIS — Z888 Allergy status to other drugs, medicaments and biological substances status: Secondary | ICD-10-CM | POA: Diagnosis not present

## 2020-03-20 DIAGNOSIS — Z6838 Body mass index (BMI) 38.0-38.9, adult: Secondary | ICD-10-CM | POA: Diagnosis not present

## 2020-03-20 DIAGNOSIS — I10 Essential (primary) hypertension: Secondary | ICD-10-CM | POA: Diagnosis not present

## 2020-03-20 DIAGNOSIS — I440 Atrioventricular block, first degree: Secondary | ICD-10-CM | POA: Diagnosis not present

## 2020-03-20 DIAGNOSIS — Z833 Family history of diabetes mellitus: Secondary | ICD-10-CM | POA: Diagnosis not present

## 2020-03-20 DIAGNOSIS — R0902 Hypoxemia: Secondary | ICD-10-CM | POA: Diagnosis not present

## 2020-03-20 DIAGNOSIS — J811 Chronic pulmonary edema: Secondary | ICD-10-CM | POA: Diagnosis not present

## 2020-03-20 DIAGNOSIS — R0602 Shortness of breath: Secondary | ICD-10-CM | POA: Diagnosis not present

## 2020-03-20 DIAGNOSIS — I712 Thoracic aortic aneurysm, without rupture: Secondary | ICD-10-CM | POA: Diagnosis not present

## 2020-03-20 DIAGNOSIS — I110 Hypertensive heart disease with heart failure: Secondary | ICD-10-CM | POA: Diagnosis present

## 2020-03-20 DIAGNOSIS — I5033 Acute on chronic diastolic (congestive) heart failure: Secondary | ICD-10-CM | POA: Diagnosis not present

## 2020-03-20 DIAGNOSIS — J90 Pleural effusion, not elsewhere classified: Secondary | ICD-10-CM | POA: Diagnosis not present

## 2020-03-20 DIAGNOSIS — U071 COVID-19: Secondary | ICD-10-CM | POA: Diagnosis present

## 2020-03-20 DIAGNOSIS — R001 Bradycardia, unspecified: Secondary | ICD-10-CM | POA: Diagnosis not present

## 2020-03-20 DIAGNOSIS — Z952 Presence of prosthetic heart valve: Secondary | ICD-10-CM | POA: Diagnosis not present

## 2020-03-20 DIAGNOSIS — J9601 Acute respiratory failure with hypoxia: Secondary | ICD-10-CM | POA: Diagnosis present

## 2020-03-20 DIAGNOSIS — D6869 Other thrombophilia: Secondary | ICD-10-CM | POA: Diagnosis present

## 2020-03-20 DIAGNOSIS — I517 Cardiomegaly: Secondary | ICD-10-CM | POA: Diagnosis not present

## 2020-03-20 DIAGNOSIS — Z79899 Other long term (current) drug therapy: Secondary | ICD-10-CM | POA: Diagnosis not present

## 2020-03-21 DIAGNOSIS — E1165 Type 2 diabetes mellitus with hyperglycemia: Secondary | ICD-10-CM | POA: Diagnosis not present

## 2020-03-21 DIAGNOSIS — Z794 Long term (current) use of insulin: Secondary | ICD-10-CM | POA: Diagnosis not present

## 2020-03-21 DIAGNOSIS — I10 Essential (primary) hypertension: Secondary | ICD-10-CM | POA: Diagnosis not present

## 2020-03-21 DIAGNOSIS — J9601 Acute respiratory failure with hypoxia: Secondary | ICD-10-CM | POA: Diagnosis not present

## 2020-03-21 DIAGNOSIS — J1282 Pneumonia due to coronavirus disease 2019: Secondary | ICD-10-CM | POA: Diagnosis not present

## 2020-03-21 DIAGNOSIS — I5033 Acute on chronic diastolic (congestive) heart failure: Secondary | ICD-10-CM | POA: Diagnosis not present

## 2020-03-21 DIAGNOSIS — B20 Human immunodeficiency virus [HIV] disease: Secondary | ICD-10-CM | POA: Diagnosis not present

## 2020-03-21 DIAGNOSIS — D696 Thrombocytopenia, unspecified: Secondary | ICD-10-CM | POA: Diagnosis not present

## 2020-03-21 DIAGNOSIS — U071 COVID-19: Secondary | ICD-10-CM | POA: Diagnosis not present

## 2020-03-21 DIAGNOSIS — Z6838 Body mass index (BMI) 38.0-38.9, adult: Secondary | ICD-10-CM | POA: Diagnosis not present

## 2020-03-21 DIAGNOSIS — I350 Nonrheumatic aortic (valve) stenosis: Secondary | ICD-10-CM | POA: Diagnosis not present

## 2020-03-22 DIAGNOSIS — I5033 Acute on chronic diastolic (congestive) heart failure: Secondary | ICD-10-CM | POA: Diagnosis not present

## 2020-03-22 DIAGNOSIS — J9601 Acute respiratory failure with hypoxia: Secondary | ICD-10-CM | POA: Diagnosis not present

## 2020-03-22 DIAGNOSIS — I350 Nonrheumatic aortic (valve) stenosis: Secondary | ICD-10-CM | POA: Diagnosis not present

## 2020-03-22 DIAGNOSIS — Z6838 Body mass index (BMI) 38.0-38.9, adult: Secondary | ICD-10-CM | POA: Diagnosis not present

## 2020-03-22 DIAGNOSIS — U071 COVID-19: Secondary | ICD-10-CM | POA: Diagnosis not present

## 2020-03-22 DIAGNOSIS — J1282 Pneumonia due to coronavirus disease 2019: Secondary | ICD-10-CM | POA: Diagnosis not present

## 2020-03-23 DIAGNOSIS — I5033 Acute on chronic diastolic (congestive) heart failure: Secondary | ICD-10-CM | POA: Diagnosis not present

## 2020-03-23 DIAGNOSIS — Z6838 Body mass index (BMI) 38.0-38.9, adult: Secondary | ICD-10-CM | POA: Diagnosis not present

## 2020-03-23 DIAGNOSIS — J1282 Pneumonia due to coronavirus disease 2019: Secondary | ICD-10-CM | POA: Diagnosis not present

## 2020-03-23 DIAGNOSIS — J9601 Acute respiratory failure with hypoxia: Secondary | ICD-10-CM | POA: Diagnosis not present

## 2020-03-23 DIAGNOSIS — U071 COVID-19: Secondary | ICD-10-CM | POA: Diagnosis not present

## 2020-03-23 DIAGNOSIS — I350 Nonrheumatic aortic (valve) stenosis: Secondary | ICD-10-CM | POA: Diagnosis not present

## 2020-03-24 DIAGNOSIS — Z6838 Body mass index (BMI) 38.0-38.9, adult: Secondary | ICD-10-CM | POA: Diagnosis not present

## 2020-03-24 DIAGNOSIS — U071 COVID-19: Secondary | ICD-10-CM | POA: Diagnosis not present

## 2020-03-24 DIAGNOSIS — I350 Nonrheumatic aortic (valve) stenosis: Secondary | ICD-10-CM | POA: Diagnosis not present

## 2020-03-24 DIAGNOSIS — J9601 Acute respiratory failure with hypoxia: Secondary | ICD-10-CM | POA: Diagnosis not present

## 2020-03-24 DIAGNOSIS — J1282 Pneumonia due to coronavirus disease 2019: Secondary | ICD-10-CM | POA: Diagnosis not present

## 2020-03-24 DIAGNOSIS — I5033 Acute on chronic diastolic (congestive) heart failure: Secondary | ICD-10-CM | POA: Diagnosis not present

## 2020-03-31 DIAGNOSIS — U071 COVID-19: Secondary | ICD-10-CM | POA: Diagnosis not present

## 2020-03-31 DIAGNOSIS — R509 Fever, unspecified: Secondary | ICD-10-CM | POA: Diagnosis not present

## 2020-03-31 DIAGNOSIS — R7989 Other specified abnormal findings of blood chemistry: Secondary | ICD-10-CM | POA: Diagnosis not present

## 2020-03-31 DIAGNOSIS — R9431 Abnormal electrocardiogram [ECG] [EKG]: Secondary | ICD-10-CM | POA: Diagnosis not present

## 2020-03-31 DIAGNOSIS — Z7982 Long term (current) use of aspirin: Secondary | ICD-10-CM | POA: Diagnosis not present

## 2020-03-31 DIAGNOSIS — I110 Hypertensive heart disease with heart failure: Secondary | ICD-10-CM | POA: Diagnosis not present

## 2020-03-31 DIAGNOSIS — I517 Cardiomegaly: Secondary | ICD-10-CM | POA: Diagnosis not present

## 2020-03-31 DIAGNOSIS — R918 Other nonspecific abnormal finding of lung field: Secondary | ICD-10-CM | POA: Diagnosis not present

## 2020-03-31 DIAGNOSIS — J9601 Acute respiratory failure with hypoxia: Secondary | ICD-10-CM | POA: Diagnosis not present

## 2020-03-31 DIAGNOSIS — J90 Pleural effusion, not elsewhere classified: Secondary | ICD-10-CM | POA: Diagnosis not present

## 2020-03-31 DIAGNOSIS — R0689 Other abnormalities of breathing: Secondary | ICD-10-CM | POA: Diagnosis not present

## 2020-03-31 DIAGNOSIS — J1282 Pneumonia due to coronavirus disease 2019: Secondary | ICD-10-CM | POA: Diagnosis not present

## 2020-03-31 DIAGNOSIS — I509 Heart failure, unspecified: Secondary | ICD-10-CM | POA: Diagnosis not present

## 2020-03-31 DIAGNOSIS — R059 Cough, unspecified: Secondary | ICD-10-CM | POA: Diagnosis not present

## 2020-03-31 DIAGNOSIS — B20 Human immunodeficiency virus [HIV] disease: Secondary | ICD-10-CM | POA: Diagnosis not present

## 2020-03-31 DIAGNOSIS — I10 Essential (primary) hypertension: Secondary | ICD-10-CM | POA: Diagnosis not present

## 2020-03-31 DIAGNOSIS — E785 Hyperlipidemia, unspecified: Secondary | ICD-10-CM | POA: Diagnosis not present

## 2020-03-31 DIAGNOSIS — E1165 Type 2 diabetes mellitus with hyperglycemia: Secondary | ICD-10-CM | POA: Diagnosis not present

## 2020-03-31 DIAGNOSIS — Z794 Long term (current) use of insulin: Secondary | ICD-10-CM | POA: Diagnosis not present

## 2020-03-31 DIAGNOSIS — R0602 Shortness of breath: Secondary | ICD-10-CM | POA: Diagnosis not present

## 2020-03-31 DIAGNOSIS — Z952 Presence of prosthetic heart valve: Secondary | ICD-10-CM | POA: Diagnosis not present

## 2020-03-31 DIAGNOSIS — Z79899 Other long term (current) drug therapy: Secondary | ICD-10-CM | POA: Diagnosis not present

## 2020-03-31 DIAGNOSIS — Z6841 Body Mass Index (BMI) 40.0 and over, adult: Secondary | ICD-10-CM | POA: Diagnosis not present

## 2020-04-01 DIAGNOSIS — I051 Rheumatic mitral insufficiency: Secondary | ICD-10-CM | POA: Diagnosis not present

## 2020-04-01 DIAGNOSIS — E1165 Type 2 diabetes mellitus with hyperglycemia: Secondary | ICD-10-CM | POA: Diagnosis not present

## 2020-04-01 DIAGNOSIS — B20 Human immunodeficiency virus [HIV] disease: Secondary | ICD-10-CM | POA: Diagnosis not present

## 2020-04-01 DIAGNOSIS — U071 COVID-19: Secondary | ICD-10-CM | POA: Diagnosis not present

## 2020-04-01 DIAGNOSIS — R509 Fever, unspecified: Secondary | ICD-10-CM | POA: Diagnosis not present

## 2020-04-01 DIAGNOSIS — Z6841 Body Mass Index (BMI) 40.0 and over, adult: Secondary | ICD-10-CM | POA: Diagnosis not present

## 2020-04-01 DIAGNOSIS — Z953 Presence of xenogenic heart valve: Secondary | ICD-10-CM | POA: Diagnosis not present

## 2020-04-01 DIAGNOSIS — Z794 Long term (current) use of insulin: Secondary | ICD-10-CM | POA: Diagnosis not present

## 2020-04-01 DIAGNOSIS — I10 Essential (primary) hypertension: Secondary | ICD-10-CM | POA: Diagnosis not present

## 2020-04-01 DIAGNOSIS — I059 Rheumatic mitral valve disease, unspecified: Secondary | ICD-10-CM | POA: Diagnosis not present

## 2020-04-01 DIAGNOSIS — J1282 Pneumonia due to coronavirus disease 2019: Secondary | ICD-10-CM | POA: Diagnosis not present

## 2020-04-01 DIAGNOSIS — E785 Hyperlipidemia, unspecified: Secondary | ICD-10-CM | POA: Diagnosis not present

## 2020-04-01 DIAGNOSIS — Z952 Presence of prosthetic heart valve: Secondary | ICD-10-CM | POA: Diagnosis not present

## 2020-04-01 DIAGNOSIS — I517 Cardiomegaly: Secondary | ICD-10-CM | POA: Diagnosis not present

## 2020-04-01 DIAGNOSIS — J9601 Acute respiratory failure with hypoxia: Secondary | ICD-10-CM | POA: Diagnosis not present

## 2020-04-07 ENCOUNTER — Encounter: Payer: Self-pay | Admitting: Osteopathic Medicine

## 2020-04-07 ENCOUNTER — Other Ambulatory Visit: Payer: Self-pay | Admitting: Osteopathic Medicine

## 2020-04-23 DIAGNOSIS — E118 Type 2 diabetes mellitus with unspecified complications: Secondary | ICD-10-CM | POA: Diagnosis not present

## 2020-04-23 DIAGNOSIS — I10 Essential (primary) hypertension: Secondary | ICD-10-CM | POA: Diagnosis not present

## 2020-04-23 DIAGNOSIS — U071 COVID-19: Secondary | ICD-10-CM | POA: Diagnosis not present

## 2020-04-23 DIAGNOSIS — Z952 Presence of prosthetic heart valve: Secondary | ICD-10-CM | POA: Diagnosis not present

## 2020-04-23 DIAGNOSIS — J1282 Pneumonia due to coronavirus disease 2019: Secondary | ICD-10-CM | POA: Diagnosis not present

## 2020-04-23 DIAGNOSIS — B20 Human immunodeficiency virus [HIV] disease: Secondary | ICD-10-CM | POA: Diagnosis not present

## 2020-05-05 ENCOUNTER — Other Ambulatory Visit: Payer: Self-pay | Admitting: Osteopathic Medicine

## 2020-05-05 DIAGNOSIS — I350 Nonrheumatic aortic (valve) stenosis: Secondary | ICD-10-CM

## 2020-05-28 DIAGNOSIS — I10 Essential (primary) hypertension: Secondary | ICD-10-CM | POA: Diagnosis not present

## 2020-05-28 DIAGNOSIS — B20 Human immunodeficiency virus [HIV] disease: Secondary | ICD-10-CM | POA: Diagnosis not present

## 2020-05-28 DIAGNOSIS — Z23 Encounter for immunization: Secondary | ICD-10-CM | POA: Diagnosis not present

## 2020-05-28 DIAGNOSIS — R768 Other specified abnormal immunological findings in serum: Secondary | ICD-10-CM | POA: Diagnosis not present

## 2020-05-28 DIAGNOSIS — E785 Hyperlipidemia, unspecified: Secondary | ICD-10-CM | POA: Diagnosis not present

## 2020-05-28 DIAGNOSIS — Z952 Presence of prosthetic heart valve: Secondary | ICD-10-CM | POA: Diagnosis not present

## 2020-05-28 DIAGNOSIS — U071 COVID-19: Secondary | ICD-10-CM | POA: Diagnosis not present

## 2020-05-28 DIAGNOSIS — R918 Other nonspecific abnormal finding of lung field: Secondary | ICD-10-CM | POA: Diagnosis not present

## 2020-05-28 DIAGNOSIS — E118 Type 2 diabetes mellitus with unspecified complications: Secondary | ICD-10-CM | POA: Diagnosis not present

## 2020-06-17 ENCOUNTER — Other Ambulatory Visit: Payer: Self-pay | Admitting: Osteopathic Medicine

## 2020-07-14 ENCOUNTER — Other Ambulatory Visit: Payer: Self-pay | Admitting: Osteopathic Medicine

## 2020-07-14 DIAGNOSIS — I350 Nonrheumatic aortic (valve) stenosis: Secondary | ICD-10-CM

## 2020-09-09 ENCOUNTER — Other Ambulatory Visit: Payer: Self-pay | Admitting: Osteopathic Medicine

## 2020-09-16 DIAGNOSIS — Z952 Presence of prosthetic heart valve: Secondary | ICD-10-CM | POA: Diagnosis not present

## 2020-09-16 DIAGNOSIS — I10 Essential (primary) hypertension: Secondary | ICD-10-CM | POA: Diagnosis not present

## 2020-09-20 ENCOUNTER — Other Ambulatory Visit: Payer: Self-pay | Admitting: Osteopathic Medicine

## 2020-09-20 DIAGNOSIS — I350 Nonrheumatic aortic (valve) stenosis: Secondary | ICD-10-CM

## 2020-10-13 DIAGNOSIS — Z952 Presence of prosthetic heart valve: Secondary | ICD-10-CM | POA: Diagnosis not present

## 2020-10-13 DIAGNOSIS — I10 Essential (primary) hypertension: Secondary | ICD-10-CM | POA: Diagnosis not present

## 2020-10-22 ENCOUNTER — Encounter: Payer: Self-pay | Admitting: Osteopathic Medicine

## 2020-10-22 DIAGNOSIS — E1165 Type 2 diabetes mellitus with hyperglycemia: Secondary | ICD-10-CM

## 2020-10-22 DIAGNOSIS — I350 Nonrheumatic aortic (valve) stenosis: Secondary | ICD-10-CM

## 2020-10-22 DIAGNOSIS — E559 Vitamin D deficiency, unspecified: Secondary | ICD-10-CM

## 2020-10-25 MED ORDER — TRESIBA FLEXTOUCH 200 UNIT/ML ~~LOC~~ SOPN: 10.0000 [IU] | PEN_INJECTOR | Freq: Every day | SUBCUTANEOUS | 0 refills | Status: DC

## 2020-10-25 MED ORDER — FUROSEMIDE 20 MG PO TABS: 20.0000 mg | ORAL_TABLET | Freq: Two times a day (BID) | ORAL | 0 refills | Status: DC

## 2020-10-25 MED ORDER — CARVEDILOL 25 MG PO TABS: 25.0000 mg | ORAL_TABLET | Freq: Two times a day (BID) | ORAL | 0 refills | Status: DC

## 2020-10-25 MED ORDER — OZEMPIC (0.25 OR 0.5 MG/DOSE) 2 MG/1.5ML ~~LOC~~ SOPN: 0.5000 mg | PEN_INJECTOR | SUBCUTANEOUS | 0 refills | Status: DC

## 2020-10-25 NOTE — Telephone Encounter (Signed)
Can refill meds x 30 days whatever he needs Other new symptoms require appt Weakness/numbness may or may not be a serious problem, agree needs appt, please call pt to confirm meds and discuss ER precautions for stroke

## 2020-11-03 ENCOUNTER — Other Ambulatory Visit: Payer: Self-pay

## 2020-11-03 ENCOUNTER — Ambulatory Visit (INDEPENDENT_AMBULATORY_CARE_PROVIDER_SITE_OTHER): Payer: Self-pay | Admitting: Osteopathic Medicine

## 2020-11-03 DIAGNOSIS — Z5329 Procedure and treatment not carried out because of patient's decision for other reasons: Secondary | ICD-10-CM

## 2020-11-03 NOTE — Progress Notes (Signed)
Pt had to leave office, he had another appointment Charging NO SHOW

## 2020-11-08 ENCOUNTER — Other Ambulatory Visit: Payer: Self-pay | Admitting: Osteopathic Medicine

## 2020-11-08 DIAGNOSIS — I350 Nonrheumatic aortic (valve) stenosis: Secondary | ICD-10-CM

## 2020-11-09 ENCOUNTER — Other Ambulatory Visit: Payer: Self-pay | Admitting: Osteopathic Medicine

## 2020-11-09 DIAGNOSIS — E1165 Type 2 diabetes mellitus with hyperglycemia: Secondary | ICD-10-CM

## 2020-11-11 ENCOUNTER — Ambulatory Visit (INDEPENDENT_AMBULATORY_CARE_PROVIDER_SITE_OTHER): Payer: BC Managed Care – PPO | Admitting: Osteopathic Medicine

## 2020-11-11 ENCOUNTER — Encounter: Payer: Self-pay | Admitting: Osteopathic Medicine

## 2020-11-11 ENCOUNTER — Other Ambulatory Visit: Payer: Self-pay

## 2020-11-11 VITALS — BP 139/85 | HR 82 | Wt 283.1 lb

## 2020-11-11 DIAGNOSIS — I350 Nonrheumatic aortic (valve) stenosis: Secondary | ICD-10-CM

## 2020-11-11 DIAGNOSIS — E1165 Type 2 diabetes mellitus with hyperglycemia: Secondary | ICD-10-CM

## 2020-11-11 LAB — POCT GLYCOSYLATED HEMOGLOBIN (HGB A1C): Hemoglobin A1C: 12.6 % — AB (ref 4.0–5.6)

## 2020-11-11 MED ORDER — VALSARTAN 320 MG PO TABS: 320.0000 mg | ORAL_TABLET | Freq: Every day | ORAL | 1 refills | Status: DC

## 2020-11-11 MED ORDER — ATORVASTATIN CALCIUM 40 MG PO TABS: 40.0000 mg | ORAL_TABLET | Freq: Every day | ORAL | 1 refills | Status: DC

## 2020-11-11 MED ORDER — EXENATIDE ER 2 MG ~~LOC~~ PEN: 2.0000 mg | PEN_INJECTOR | SUBCUTANEOUS | 1 refills | Status: DC

## 2020-11-11 MED ORDER — CARVEDILOL 25 MG PO TABS: 25.0000 mg | ORAL_TABLET | Freq: Two times a day (BID) | ORAL | 1 refills | Status: DC

## 2020-11-11 MED ORDER — FUROSEMIDE 20 MG PO TABS: 20.0000 mg | ORAL_TABLET | Freq: Two times a day (BID) | ORAL | 1 refills | Status: DC

## 2020-11-11 NOTE — Progress Notes (Signed)
Clifford Strong is a 70 y.o. male who presents to  [facility] at [facility]  today, 11/11/20, seeking care for the following:  MONITOR CHRONIC CONDITIONS AS BELOW  Works 3rd shift, has bad habit of not eating healthy, difficulty getting intentional exercise. Just took BP meds prior to appt and BP elevated on initial check up.  Following w/ cardiology, has problems w/ prosthetic valve stenosis may require additional procedures but cardiology has concerns about Glc control.      ASSESSMENT & PLAN with other pertinent findings:  The primary encounter diagnosis was Uncontrolled type 2 diabetes mellitus with hyperglycemia (HCC). A diagnosis of Nonrheumatic aortic (valve) stenosis was also pertinent to this visit.   Titrate up on insulin as below Strongly consider meal time insulin Pt has lots of room for improvement in diet  Shift work and irregular eating are barriers to consistent med dosing and consistent eating patterns and glucose checks.  Referral to clinical pharmacy    Patient Instructions  Plan for diabetes:  Long acting Insulin Tresiba (long acting insulin) 60 units daily now Measure sugar before breakfast DAILY (fasting, no food 8-ish hours before checking sugar) - goal is to get this sugar number to 100-150  If that sugar level is above 150, ADD 2 units Tresiba for 3 days or so, and increase again in STILL above 150.  Once fasting sugar stays between 100-150, stay on that dose Tresiba  Max 100 units per day  Short acting insulin May need to add meal time insulin! Depending on next A1C  Bydureon or similar Refill this, let us know if any issues getting it Increase activity Decrease sugary foods!   Orders Placed This Encounter  Procedures   POCT HgB A1C    Meds ordered this encounter  Medications   Exenatide ER 2 MG PEN    Sig: Inject 2 mg into the skin once a week.    Dispense:  6 each    Refill:  1   valsartan (DIOVAN)  320 MG tablet    Sig: Take 1 tablet (320 mg total) by mouth daily.    Dispense:  90 tablet    Refill:  1   atorvastatin (LIPITOR) 40 MG tablet    Sig: Take 1 tablet (40 mg total) by mouth daily.    Dispense:  90 tablet    Refill:  1   furosemide (LASIX) 20 MG tablet    Sig: Take 1 tablet (20 mg total) by mouth 2 (two) times daily.    Dispense:  180 tablet    Refill:  1   carvedilol (COREG) 25 MG tablet    Sig: Take 1 tablet (25 mg total) by mouth 2 (two) times daily with a meal.    Dispense:  180 tablet    Refill:  1     See below for relevant physical exam findings  See below for recent lab and imaging results reviewed  Medications, allergies, PMH, PSH, SocH, FamH reviewed below    Follow-up instructions: Return in about 3 months (around 02/10/2021) for A1C recheck w/ Dr Ashley Royalty - see [place] sooner if needed .                                        Exam:  BP 139/85 (BP Location: Left Arm, Patient Position: Sitting, Cuff Size: Large)   Pulse 82  Wt 283 lb 1.3 oz (128.4 kg)   SpO2 97%   BMI 43.04 kg/m  Constitutional: VS see above. General Appearance: alert, well-developed, well-nourished, NAD Neck: No masses, trachea midline.  Respiratory: Normal respiratory effort. no wheeze, no rhonchi, no rales Cardiovascular: S1/S2 normal, no murmur, no rub/gallop auscultated. RRR.  Musculoskeletal: Gait normal. Symmetric and independent movement of all extremities Neurological: Normal balance/coordination. No tremor. Skin: warm, dry, intact.  Psychiatric: Normal judgment/insight. Normal mood and affect. Oriented x3.   Current Meds  Medication Sig   aspirin EC 81 MG tablet Take 1 tablet (81 mg total) by mouth daily.   Ergocalciferol 2000 units CAPS Take 1 capsule by mouth daily.   Exenatide ER 2 MG PEN Inject 2 mg into the skin once a week.   glucose blood test strip Use as instructed to check blood sugars 3 times daily DX E11.9 patient has one  touch ultra mini   insulin degludec (TRESIBA FLEXTOUCH) 200 UNIT/ML FlexTouch Pen Inject 10-100 Units into the skin daily. Start at 20 units daily for 2 days, increase by 4 units at a time every 2 days until fasting sugars at goal of 100-150   Insulin Pen Needle (BD PEN NEEDLE NANO U/F) 32G X 4 MM MISC Use with Tresiba to give insulin.   Lancets (ONETOUCH ULTRASOFT) lancets Use as instructed to check blood sugars 3 times daily DX E11.9 patient has one touch ultra mini   [DISCONTINUED] carvedilol (COREG) 25 MG tablet TAKE 1 TABLET (25 MG TOTAL) BY MOUTH 2 (TWO) TIMES DAILY WITH A MEAL.   [DISCONTINUED] furosemide (LASIX) 20 MG tablet Take 1 tablet (20 mg total) by mouth 2 (two) times daily.   [DISCONTINUED] Semaglutide,0.25 or 0.5MG /DOS, (OZEMPIC, 0.25 OR 0.5 MG/DOSE,) 2 MG/1.5ML SOPN INJECT 0.5 MG INTO THE SKIN ONCE A WEEK.    Allergies  Allergen Reactions   Lisinopril Cough    Patient Active Problem List   Diagnosis Date Noted   Severe aortic stenosis 12/15/2018   Hyponatremia 12/15/2018   Nonrheumatic aortic valve stenosis 05/03/2018   Diastolic dysfunction without heart failure 11/26/2017   Uncontrolled type 2 diabetes mellitus with hyperglycemia (HCC) 11/02/2017   Dyspnea on exertion 11/02/2017   Peripheral edema 11/02/2017   At risk for obstructive sleep apnea 04/16/2017   Degenerative joint disease (DJD) of hip 04/09/2017   Vitamin D deficiency 04/04/2017   Encounter for monitoring statin therapy 04/04/2017   Onychomycosis of multiple toenails with type 2 diabetes mellitus (HCC) 04/04/2017   Hypertension associated with diabetes (HCC) 04/04/2017   Class 3 severe obesity due to excess calories with serious comorbidity and body mass index (BMI) of 40.0 to 44.9 in adult [facility]) 04/04/2017   Colon cancer screening 04/04/2017   Comprehensive diabetic foot examination, type 2 DM, encounter for (HCC) 04/04/2017   Hyperlipidemia associated with type 2 diabetes mellitus (HCC) 03/12/2017    Status post aortic valve repair 03/12/2017   Controlled type 2 diabetes mellitus with microalbuminuria, without long-term current use of insulin (HCC) 03/12/2017   Dyslipidemia associated with type 2 diabetes mellitus (HCC) 03/12/2017   HIV disease (HCC) 03/12/2017   Anemia 04/28/2015   Microalbuminuria due to type 2 diabetes mellitus (HCC) 04/28/2015   CMV (cytomegalovirus) antibody positive 04/06/2015   Urinary calculus, unspecified 02/08/2009   Nocturia 08/07/2007    Family History  Problem Relation Age of Onset   Diabetes Mother    Hypertension Mother    Heart attack Mother    Diabetes Father    Hypertension  Father    Lung disease Father     Social History   Tobacco Use  Smoking Status Never  Smokeless Tobacco Never    Past Surgical History:  Procedure Laterality Date   AORTIC VALVE REPLACEMENT  2014    Immunization History  Administered Date(s) Administered   19-influenza Whole 12/29/2014   Fluad Quad(high Dose 65+) 12/07/2015   Influenza, High Dose Seasonal PF 12/29/2014, 12/07/2015, 01/03/2017, 12/24/2017   Influenza, Quadrivalent, Recombinant, Inj, Pf 12/29/2014   Influenza,inj,Quad PF,6+ Mos 12/29/2014   Influenza-Unspecified 12/05/2018   Meningococcal Conjugate 10/22/2017, 04/19/2018   PFIZER Comirnaty(Gray Top)Covid-19 Tri-Sucrose Vaccine 05/28/2020   Pneumococcal Conjugate-13 12/07/2015   Pneumococcal Polysaccharide-23 06/27/2010, 04/18/2016   Tdap 06/27/2010, 12/07/2015   Zoster Recombinat (Shingrix) 04/30/2017, 10/22/2017   Zoster, Live 02/07/2012    Recent Results (from the past 2160 hour(s))  POCT HgB A1C     Status: Abnormal   Collection Time: 11/11/20  8:35 AM  Result Value Ref Range   Hemoglobin A1C 12.6 (A) 4.0 - 5.6 %   HbA1c POC (<> result, manual entry)     HbA1c, POC (prediabetic range)     HbA1c, POC (controlled diabetic range)      No results found.     All questions at time of visit were answered - patient instructed to  contact office with any additional concerns or updates. ER/RTC precautions were reviewed with the patient as applicable.   Please note: manual typing as well as voice recognition software may have been used to produce this document - typos may escape review. Please contact Dr. Lyn Hollingshead for any needed clarifications.

## 2020-11-11 NOTE — Patient Instructions (Addendum)
Plan for diabetes:  Long acting Insulin Tresiba (long acting insulin) 60 units daily now Measure sugar before breakfast DAILY (fasting, no food 8-ish hours before checking sugar) - goal is to get this sugar number to 100-150  If that sugar level is above 150, ADD 2 units Tresiba for 3 days or so, and increase again in STILL above 150.  Once fasting sugar stays between 100-150, stay on that dose Tresiba  Max 100 units per day  Short acting insulin May need to add meal time insulin! Depending on next A1C  Bydureon or similar Refill this, let us know if any issues getting it Increase activity Decrease sugary foods!

## 2020-11-12 ENCOUNTER — Telehealth: Payer: Self-pay | Admitting: *Deleted

## 2020-11-12 NOTE — Chronic Care Management (AMB) (Signed)
  Chronic Care Management   Outreach Note  11/12/2020 Name: Maxten Shuler MRN: [id number] DOB: January 31, 1951  Hayven Fatima is a 70 y.o. year old male who is a primary care patient of Sunnie Nielsen, DO. I reached out to [employer] by phone today in response to a referral sent by Mr. Zaven Bourget's PCP Sunnie Nielsen, DO.     An unsuccessful telephone outreach was attempted today. The patient was referred to the case management team for assistance with care management and care coordination.   Follow Up Plan: A HIPAA compliant phone message was left for the patient providing contact information and requesting a return call.  If patient returns call to provider office, please advise to call Embedded Care Management Care Guide Eydie Wormley at [phone]  Burman Nieves, CCMA Care Guide, Embedded Care Coordination [facility]  Care Management  Direct Dial: [phone]

## 2020-11-16 NOTE — Chronic Care Management (AMB) (Signed)
  Chronic Care Management   Outreach Note  11/16/2020 Name: [name] MRN: [id number] DOB: 03-16-1950  [name] is a 70 y.o. year old male who is a primary care patient of Sunnie Nielsen, DO.. I reached out to Madelaine Etienne by phone today in response to a referral sent by Mr. Latavious Aprea's PCP Sunnie Nielsen, DO.     A second unsuccessful telephone outreach was attempted today. The patient was referred to the case management team for assistance with care management and care coordination.   Follow Up Plan: A HIPAA compliant phone message was left for the patient providing contact information and requesting a return call.  If patient returns call to provider office, please advise to call Embedded Care Management Care Guide Jailin Manocchio at [phone]  Burman Nieves, CCMA Care Guide, Embedded Care Coordination [facility]  Care Management  Direct Dial: [phone]

## 2020-11-18 DIAGNOSIS — E1165 Type 2 diabetes mellitus with hyperglycemia: Secondary | ICD-10-CM | POA: Diagnosis not present

## 2020-11-18 DIAGNOSIS — Z23 Encounter for immunization: Secondary | ICD-10-CM | POA: Diagnosis not present

## 2020-11-18 DIAGNOSIS — B20 Human immunodeficiency virus [HIV] disease: Secondary | ICD-10-CM | POA: Diagnosis not present

## 2020-11-18 DIAGNOSIS — Z952 Presence of prosthetic heart valve: Secondary | ICD-10-CM | POA: Diagnosis not present

## 2020-11-18 DIAGNOSIS — Z794 Long term (current) use of insulin: Secondary | ICD-10-CM | POA: Diagnosis not present

## 2020-11-18 DIAGNOSIS — E785 Hyperlipidemia, unspecified: Secondary | ICD-10-CM | POA: Diagnosis not present

## 2020-11-18 DIAGNOSIS — I10 Essential (primary) hypertension: Secondary | ICD-10-CM | POA: Diagnosis not present

## 2020-11-22 NOTE — Chronic Care Management (AMB) (Signed)
  Chronic Care Management   Outreach Note  11/22/2020 Name: Muad Noga MRN: [id number] DOB: 05-05-1950  Alexios Keown is a 71 y.o. year old male who is a primary care patient of No primary care provider on file.. I reached out to Madelaine Etienne by phone today in response to a referral sent by Mr. Amir Collazos's PCP Sunnie Nielsen, DO     Third unsuccessful telephone outreach was attempted today. The patient was referred to the case management team for assistance with care management and care coordination. The patient's primary care provider has been notified of our unsuccessful attempts to make or maintain contact with the patient. The care management team is pleased to engage with this patient at any time in the future should he/she be interested in assistance from the care management team.   Follow Up Plan: We have been unable to make contact with the patient for follow up. The care management team is available to follow up with the patient after provider conversation with the patient regarding recommendation for care management engagement and subsequent re-referral to the care management team.   Burman Nieves, CCMA Care Guide, Embedded Care Coordination [facility]  Care Management  Direct Dial: [phone]

## 2020-12-01 DIAGNOSIS — I10 Essential (primary) hypertension: Secondary | ICD-10-CM | POA: Diagnosis not present

## 2020-12-01 DIAGNOSIS — I350 Nonrheumatic aortic (valve) stenosis: Secondary | ICD-10-CM | POA: Diagnosis not present

## 2020-12-01 DIAGNOSIS — Z952 Presence of prosthetic heart valve: Secondary | ICD-10-CM | POA: Diagnosis not present

## 2020-12-16 DIAGNOSIS — I2510 Atherosclerotic heart disease of native coronary artery without angina pectoris: Secondary | ICD-10-CM | POA: Diagnosis not present

## 2020-12-16 DIAGNOSIS — Y828 Other medical devices associated with adverse incidents: Secondary | ICD-10-CM | POA: Diagnosis not present

## 2020-12-16 DIAGNOSIS — I059 Rheumatic mitral valve disease, unspecified: Secondary | ICD-10-CM | POA: Diagnosis not present

## 2020-12-16 DIAGNOSIS — T82857A Stenosis of cardiac prosthetic devices, implants and grafts, initial encounter: Secondary | ICD-10-CM | POA: Diagnosis not present

## 2020-12-16 DIAGNOSIS — E785 Hyperlipidemia, unspecified: Secondary | ICD-10-CM | POA: Diagnosis not present

## 2020-12-16 DIAGNOSIS — Z794 Long term (current) use of insulin: Secondary | ICD-10-CM | POA: Diagnosis not present

## 2020-12-16 DIAGNOSIS — Z953 Presence of xenogenic heart valve: Secondary | ICD-10-CM | POA: Diagnosis not present

## 2020-12-16 DIAGNOSIS — I10 Essential (primary) hypertension: Secondary | ICD-10-CM | POA: Diagnosis not present

## 2020-12-16 DIAGNOSIS — E1169 Type 2 diabetes mellitus with other specified complication: Secondary | ICD-10-CM | POA: Diagnosis not present

## 2020-12-16 DIAGNOSIS — I350 Nonrheumatic aortic (valve) stenosis: Secondary | ICD-10-CM | POA: Diagnosis not present

## 2020-12-16 DIAGNOSIS — I77810 Thoracic aortic ectasia: Secondary | ICD-10-CM | POA: Diagnosis not present

## 2020-12-16 DIAGNOSIS — Z21 Asymptomatic human immunodeficiency virus [HIV] infection status: Secondary | ICD-10-CM | POA: Diagnosis not present

## 2020-12-16 DIAGNOSIS — Z79899 Other long term (current) drug therapy: Secondary | ICD-10-CM | POA: Diagnosis not present

## 2020-12-16 DIAGNOSIS — Z7982 Long term (current) use of aspirin: Secondary | ICD-10-CM | POA: Diagnosis not present

## 2020-12-17 DIAGNOSIS — I10 Essential (primary) hypertension: Secondary | ICD-10-CM | POA: Diagnosis not present

## 2020-12-17 DIAGNOSIS — T82857D Stenosis of cardiac prosthetic devices, implants and grafts, subsequent encounter: Secondary | ICD-10-CM | POA: Diagnosis not present

## 2020-12-30 DIAGNOSIS — I350 Nonrheumatic aortic (valve) stenosis: Secondary | ICD-10-CM | POA: Diagnosis not present

## 2020-12-30 DIAGNOSIS — N2889 Other specified disorders of kidney and ureter: Secondary | ICD-10-CM | POA: Diagnosis not present

## 2020-12-30 DIAGNOSIS — I2510 Atherosclerotic heart disease of native coronary artery without angina pectoris: Secondary | ICD-10-CM | POA: Diagnosis not present

## 2020-12-30 DIAGNOSIS — J984 Other disorders of lung: Secondary | ICD-10-CM | POA: Diagnosis not present

## 2020-12-30 DIAGNOSIS — I7121 Aneurysm of the ascending aorta, without rupture: Secondary | ICD-10-CM | POA: Diagnosis not present

## 2020-12-30 DIAGNOSIS — N323 Diverticulum of bladder: Secondary | ICD-10-CM | POA: Diagnosis not present

## 2020-12-30 DIAGNOSIS — Z01810 Encounter for preprocedural cardiovascular examination: Secondary | ICD-10-CM | POA: Diagnosis not present

## 2020-12-30 DIAGNOSIS — J439 Emphysema, unspecified: Secondary | ICD-10-CM | POA: Diagnosis not present

## 2020-12-30 DIAGNOSIS — R0602 Shortness of breath: Secondary | ICD-10-CM | POA: Diagnosis not present

## 2021-01-26 DIAGNOSIS — I350 Nonrheumatic aortic (valve) stenosis: Secondary | ICD-10-CM | POA: Diagnosis not present

## 2021-01-26 DIAGNOSIS — I10 Essential (primary) hypertension: Secondary | ICD-10-CM | POA: Diagnosis not present

## 2021-02-07 ENCOUNTER — Other Ambulatory Visit: Payer: Self-pay | Admitting: Osteopathic Medicine

## 2021-02-07 DIAGNOSIS — E1165 Type 2 diabetes mellitus with hyperglycemia: Secondary | ICD-10-CM

## 2021-02-10 ENCOUNTER — Ambulatory Visit: Payer: BC Managed Care – PPO | Admitting: Family Medicine

## 2021-03-08 ENCOUNTER — Ambulatory Visit (INDEPENDENT_AMBULATORY_CARE_PROVIDER_SITE_OTHER): Payer: BC Managed Care – PPO | Admitting: Family Medicine

## 2021-03-08 ENCOUNTER — Encounter: Payer: Self-pay | Admitting: Family Medicine

## 2021-03-08 ENCOUNTER — Other Ambulatory Visit: Payer: Self-pay

## 2021-03-08 VITALS — BP 131/73 | HR 73 | Ht 68.0 in | Wt 290.0 lb

## 2021-03-08 DIAGNOSIS — E1159 Type 2 diabetes mellitus with other circulatory complications: Secondary | ICD-10-CM

## 2021-03-08 DIAGNOSIS — I152 Hypertension secondary to endocrine disorders: Secondary | ICD-10-CM

## 2021-03-08 DIAGNOSIS — I350 Nonrheumatic aortic (valve) stenosis: Secondary | ICD-10-CM

## 2021-03-08 DIAGNOSIS — E1165 Type 2 diabetes mellitus with hyperglycemia: Secondary | ICD-10-CM | POA: Diagnosis not present

## 2021-03-08 DIAGNOSIS — E1169 Type 2 diabetes mellitus with other specified complication: Secondary | ICD-10-CM

## 2021-03-08 DIAGNOSIS — B20 Human immunodeficiency virus [HIV] disease: Secondary | ICD-10-CM

## 2021-03-08 DIAGNOSIS — E785 Hyperlipidemia, unspecified: Secondary | ICD-10-CM

## 2021-03-08 NOTE — Assessment & Plan Note (Signed)
Managed by infectious disease through [facility], stable at this time.

## 2021-03-08 NOTE — Progress Notes (Signed)
Clifford Strong - 71 y.o. male MRN [id number]  Date of birth: 03/29/1950  Subjective Chief Complaint  Patient presents with   Follow-up    HPI Clifford Strong is a 71 year old male for follow-up visit.  He is transferring care from Dr. Lyn Hollingshead.  He has a history of uncontrolled type 2 diabetes, HIV, hypertension, hyperlipidemia and aortic stenosis.  He had prior aortic valve replacement however has since developed severe aortic stenosis.  He has having increased dyspnea with exertion.  Recently underwent evaluation for TAVR.  HIV is managed by infectious disease through [facility].  CD4 counts have been stable with undetectable viral load this past September.  Diabetes has not been well controlled.  Currently taking Tresiba 100 units daily.  Prescribed Bydureon previously as well However has not been taking this.  ROS:  A comprehensive ROS was completed and negative except as noted per HPI  Allergies  Allergen Reactions   Lisinopril Cough    Past Medical History:  Diagnosis Date   Aortic stenosis    Degenerative joint disease (DJD) of hip 04/09/2017   Diabetes mellitus without complication (HCC)    Heart murmur    HIV infection (HCC)    Hypertension    Obesity     Past Surgical History:  Procedure Laterality Date   AORTIC VALVE REPLACEMENT  2014    Social History   Socioeconomic History   Marital status: Married    Spouse name: Not on file   Number of children: Not on file   Years of education: Not on file   Highest education level: Not on file  Occupational History   Not on file  Tobacco Use   Smoking status: Never   Smokeless tobacco: Never  Vaping Use   Vaping Use: Never used  Substance and Sexual Activity   Alcohol use: No   Drug use: No   Sexual activity: Never    Partners: Female  Other Topics Concern   Not on file  Social History Narrative   Not on file   Social Determinants of Health   Financial Resource Strain: Not on file  Food Insecurity: Not on  file  Transportation Needs: Not on file  Physical Activity: Not on file  Stress: Not on file  Social Connections: Not on file    Family History  Problem Relation Age of Onset   Diabetes Mother    Hypertension Mother    Heart attack Mother    Diabetes Father    Hypertension Father    Lung disease Father     Health Maintenance  Topic Date Due   COLON CANCER SCREENING ANNUAL FOBT  Never done   COLONOSCOPY (Pts 45-43yrs Insurance coverage will need to be confirmed)  Never done   OPHTHALMOLOGY EXAM  07/06/2018   FOOT EXAM  05/04/2019   COVID-19 Vaccine (2 - Pfizer risk series) 06/18/2020   HEMOGLOBIN A1C  05/11/2021   TETANUS/TDAP  12/06/2025   Pneumonia Vaccine 7+ Years old  Completed   INFLUENZA VACCINE  Completed   Hepatitis C Screening  Completed   Zoster Vaccines- Shingrix  Completed   HPV VACCINES  Aged Out     ----------------------------------------------------------------------------------------------------------------------------------------------------------------------------------------------------------------- Physical Exam BP 131/73 (BP Location: Left Arm, Patient Position: Sitting, Cuff Size: Large)    Pulse 73    Ht 5\' 8"  (1.727 m)    Wt 290 lb (131.5 kg)    SpO2 96%    BMI 44.09 kg/m   Physical Exam Constitutional:      Appearance: Normal  appearance.  HENT:     Head: Normocephalic and atraumatic.  Eyes:     General: No scleral icterus. Cardiovascular:     Rate and Rhythm: Normal rate and regular rhythm.  Pulmonary:     Effort: Pulmonary effort is normal.     Breath sounds: Normal breath sounds.  Musculoskeletal:     Cervical back: Neck supple.  Neurological:     Mental Status: He is alert.  Psychiatric:        Mood and Affect: Mood normal.        Behavior: Behavior normal.     ------------------------------------------------------------------------------------------------------------------------------------------------------------------------------------------------------------------- Assessment and Plan  Severe aortic stenosis Plans to have TAVR for severe symptomatic aortic stenosis.  Hypertension associated with diabetes (HCC) Diabetes remains well controlled at this time.  Recommend continuation of current medications.  Uncontrolled type 2 diabetes mellitus with hyperglycemia (HCC) Diabetes has been poorly controlled.  Discussed working on dietary change and continue titration of Tresiba.  I think adding back on a GLP-1 would be beneficial to him as well.  We will see what his current A1c is.  Hyperlipidemia associated with type 2 diabetes mellitus (HCC) Tolerating atorvastatin well, continue current strength.  HIV disease (HCC) Managed by infectious disease through [facility], stable at this time.   No orders of the defined types were placed in this encounter.   Return in about 3 months (around 06/06/2021) for T2DM/HTN.    This visit occurred during the SARS-CoV-2 public health emergency.  Safety protocols were in place, including screening questions prior to the visit, additional usage of staff PPE, and extensive cleaning of exam room while observing appropriate contact time as indicated for disinfecting solutions.

## 2021-03-08 NOTE — Assessment & Plan Note (Signed)
Plans to have TAVR for severe symptomatic aortic stenosis.

## 2021-03-08 NOTE — Patient Instructions (Signed)
Nice to meet you today! Try alpha-lipoic acid for nerve pain We'll be in touch with lab results and recommendations.

## 2021-03-08 NOTE — Assessment & Plan Note (Signed)
Diabetes remains well controlled at this time.  Recommend continuation of current medications.

## 2021-03-08 NOTE — Assessment & Plan Note (Signed)
Tolerating atorvastatin well, continue current strength.

## 2021-03-08 NOTE — Assessment & Plan Note (Signed)
Diabetes has been poorly controlled.  Discussed working on dietary change and continue titration of Tresiba.  I think adding back on a GLP-1 would be beneficial to him as well.  We will see what his current A1c is.

## 2021-03-09 LAB — CBC WITH DIFFERENTIAL/PLATELET
Absolute Monocytes: 631 cells/uL (ref 200–950)
Basophils Absolute: 76 cells/uL (ref 0–200)
Basophils Relative: 1 %
Eosinophils Absolute: 167 cells/uL (ref 15–500)
Eosinophils Relative: 2.2 %
HCT: 45.4 % (ref 38.5–50.0)
Hemoglobin: 15.4 g/dL (ref 13.2–17.1)
Lymphs Abs: 1885 cells/uL (ref 850–3900)
MCH: 30.6 pg (ref 27.0–33.0)
MCHC: 33.9 g/dL (ref 32.0–36.0)
MCV: 90.3 fL (ref 80.0–100.0)
MPV: 10.8 fL (ref 7.5–12.5)
Monocytes Relative: 8.3 %
Neutro Abs: 4841 cells/uL (ref 1500–7800)
Neutrophils Relative %: 63.7 %
Platelets: 162 10*3/uL (ref 140–400)
RBC: 5.03 10*6/uL (ref 4.20–5.80)
RDW: 12.5 % (ref 11.0–15.0)
Total Lymphocyte: 24.8 %
WBC: 7.6 10*3/uL (ref 3.8–10.8)

## 2021-03-09 LAB — COMPLETE METABOLIC PANEL WITH GFR
AG Ratio: 1.4 (calc) (ref 1.0–2.5)
ALT: 21 U/L (ref 9–46)
AST: 17 U/L (ref 10–35)
Albumin: 4.4 g/dL (ref 3.6–5.1)
Alkaline phosphatase (APISO): 87 U/L (ref 35–144)
BUN: 23 mg/dL (ref 7–25)
CO2: 30 mmol/L (ref 20–32)
Calcium: 10.2 mg/dL (ref 8.6–10.3)
Chloride: 98 mmol/L (ref 98–110)
Creat: 1.06 mg/dL (ref 0.70–1.28)
Globulin: 3.2 g/dL (calc) (ref 1.9–3.7)
Glucose, Bld: 225 mg/dL — ABNORMAL HIGH (ref 65–99)
Potassium: 4.2 mmol/L (ref 3.5–5.3)
Sodium: 136 mmol/L (ref 135–146)
Total Bilirubin: 0.7 mg/dL (ref 0.2–1.2)
Total Protein: 7.6 g/dL (ref 6.1–8.1)
eGFR: 75 mL/min/{1.73_m2} (ref >=60)

## 2021-03-09 LAB — HEMOGLOBIN A1C
Hgb A1c MFr Bld: 10.5 % of total Hgb — ABNORMAL HIGH (ref <5.7)
Mean Plasma Glucose: 255 mg/dL
eAG (mmol/L): 14.1 mmol/L

## 2021-03-09 LAB — LIPID PANEL W/REFLEX DIRECT LDL
Cholesterol: 105 mg/dL (ref <200)
HDL: 35 mg/dL — ABNORMAL LOW (ref >=40)
LDL Cholesterol (Calc): 48 mg/dL (calc)
Non-HDL Cholesterol (Calc): 70 mg/dL (calc) (ref <130)
Total CHOL/HDL Ratio: 3 (calc) (ref <5.0)
Triglycerides: 135 mg/dL (ref <150)

## 2021-03-31 DIAGNOSIS — Z01810 Encounter for preprocedural cardiovascular examination: Secondary | ICD-10-CM | POA: Diagnosis not present

## 2021-03-31 DIAGNOSIS — Z01818 Encounter for other preprocedural examination: Secondary | ICD-10-CM | POA: Diagnosis not present

## 2021-03-31 DIAGNOSIS — Z9889 Other specified postprocedural states: Secondary | ICD-10-CM | POA: Diagnosis not present

## 2021-03-31 DIAGNOSIS — I4519 Other right bundle-branch block: Secondary | ICD-10-CM | POA: Diagnosis not present

## 2021-03-31 DIAGNOSIS — I10 Essential (primary) hypertension: Secondary | ICD-10-CM | POA: Diagnosis not present

## 2021-03-31 DIAGNOSIS — Z79899 Other long term (current) drug therapy: Secondary | ICD-10-CM | POA: Diagnosis not present

## 2021-03-31 DIAGNOSIS — R0989 Other specified symptoms and signs involving the circulatory and respiratory systems: Secondary | ICD-10-CM | POA: Diagnosis not present

## 2021-03-31 DIAGNOSIS — I350 Nonrheumatic aortic (valve) stenosis: Secondary | ICD-10-CM | POA: Diagnosis not present

## 2021-03-31 DIAGNOSIS — J8410 Pulmonary fibrosis, unspecified: Secondary | ICD-10-CM | POA: Diagnosis not present

## 2021-03-31 DIAGNOSIS — Z01812 Encounter for preprocedural laboratory examination: Secondary | ICD-10-CM | POA: Diagnosis not present

## 2021-03-31 DIAGNOSIS — I440 Atrioventricular block, first degree: Secondary | ICD-10-CM | POA: Diagnosis not present

## 2021-04-05 DIAGNOSIS — I2510 Atherosclerotic heart disease of native coronary artery without angina pectoris: Secondary | ICD-10-CM | POA: Diagnosis present

## 2021-04-05 DIAGNOSIS — Z736 Limitation of activities due to disability: Secondary | ICD-10-CM | POA: Diagnosis not present

## 2021-04-05 DIAGNOSIS — I350 Nonrheumatic aortic (valve) stenosis: Secondary | ICD-10-CM | POA: Diagnosis not present

## 2021-04-05 DIAGNOSIS — Z954 Presence of other heart-valve replacement: Secondary | ICD-10-CM | POA: Diagnosis not present

## 2021-04-05 DIAGNOSIS — Z8616 Personal history of COVID-19: Secondary | ICD-10-CM | POA: Diagnosis not present

## 2021-04-05 DIAGNOSIS — Z888 Allergy status to other drugs, medicaments and biological substances status: Secondary | ICD-10-CM | POA: Diagnosis not present

## 2021-04-05 DIAGNOSIS — E785 Hyperlipidemia, unspecified: Secondary | ICD-10-CM | POA: Diagnosis present

## 2021-04-05 DIAGNOSIS — I440 Atrioventricular block, first degree: Secondary | ICD-10-CM | POA: Diagnosis present

## 2021-04-05 DIAGNOSIS — T82857A Stenosis of cardiac prosthetic devices, implants and grafts, initial encounter: Secondary | ICD-10-CM | POA: Diagnosis present

## 2021-04-05 DIAGNOSIS — Z7982 Long term (current) use of aspirin: Secondary | ICD-10-CM | POA: Diagnosis not present

## 2021-04-05 DIAGNOSIS — G8929 Other chronic pain: Secondary | ICD-10-CM | POA: Diagnosis present

## 2021-04-05 DIAGNOSIS — Z006 Encounter for examination for normal comparison and control in clinical research program: Secondary | ICD-10-CM | POA: Diagnosis not present

## 2021-04-05 DIAGNOSIS — J8410 Pulmonary fibrosis, unspecified: Secondary | ICD-10-CM | POA: Diagnosis not present

## 2021-04-05 DIAGNOSIS — Z794 Long term (current) use of insulin: Secondary | ICD-10-CM | POA: Diagnosis not present

## 2021-04-05 DIAGNOSIS — I509 Heart failure, unspecified: Secondary | ICD-10-CM | POA: Diagnosis present

## 2021-04-05 DIAGNOSIS — I5022 Chronic systolic (congestive) heart failure: Secondary | ICD-10-CM | POA: Diagnosis present

## 2021-04-05 DIAGNOSIS — B20 Human immunodeficiency virus [HIV] disease: Secondary | ICD-10-CM | POA: Diagnosis present

## 2021-04-05 DIAGNOSIS — R0602 Shortness of breath: Secondary | ICD-10-CM | POA: Diagnosis not present

## 2021-04-05 DIAGNOSIS — M25551 Pain in right hip: Secondary | ICD-10-CM | POA: Diagnosis present

## 2021-04-05 DIAGNOSIS — Z79899 Other long term (current) drug therapy: Secondary | ICD-10-CM | POA: Diagnosis not present

## 2021-04-05 DIAGNOSIS — I4510 Unspecified right bundle-branch block: Secondary | ICD-10-CM | POA: Diagnosis present

## 2021-04-05 DIAGNOSIS — E1165 Type 2 diabetes mellitus with hyperglycemia: Secondary | ICD-10-CM | POA: Diagnosis present

## 2021-04-05 DIAGNOSIS — I517 Cardiomegaly: Secondary | ICD-10-CM | POA: Diagnosis not present

## 2021-04-05 DIAGNOSIS — Z6841 Body Mass Index (BMI) 40.0 and over, adult: Secondary | ICD-10-CM | POA: Diagnosis not present

## 2021-04-05 DIAGNOSIS — I110 Hypertensive heart disease with heart failure: Secondary | ICD-10-CM | POA: Diagnosis present

## 2021-04-12 DIAGNOSIS — I350 Nonrheumatic aortic (valve) stenosis: Secondary | ICD-10-CM | POA: Diagnosis not present

## 2021-04-12 DIAGNOSIS — Z953 Presence of xenogenic heart valve: Secondary | ICD-10-CM | POA: Diagnosis not present

## 2021-04-12 DIAGNOSIS — I4510 Unspecified right bundle-branch block: Secondary | ICD-10-CM | POA: Diagnosis not present

## 2021-04-12 DIAGNOSIS — I10 Essential (primary) hypertension: Secondary | ICD-10-CM | POA: Diagnosis not present

## 2021-04-20 DIAGNOSIS — Z953 Presence of xenogenic heart valve: Secondary | ICD-10-CM | POA: Diagnosis not present

## 2021-04-20 DIAGNOSIS — I10 Essential (primary) hypertension: Secondary | ICD-10-CM | POA: Diagnosis not present

## 2021-04-29 DIAGNOSIS — R002 Palpitations: Secondary | ICD-10-CM | POA: Diagnosis not present

## 2021-04-29 DIAGNOSIS — Z953 Presence of xenogenic heart valve: Secondary | ICD-10-CM | POA: Diagnosis not present

## 2021-05-04 ENCOUNTER — Other Ambulatory Visit: Payer: Self-pay

## 2021-05-04 ENCOUNTER — Other Ambulatory Visit: Payer: Self-pay | Admitting: Osteopathic Medicine

## 2021-05-04 DIAGNOSIS — I350 Nonrheumatic aortic (valve) stenosis: Secondary | ICD-10-CM

## 2021-05-04 DIAGNOSIS — M25551 Pain in right hip: Secondary | ICD-10-CM

## 2021-05-05 DIAGNOSIS — Z953 Presence of xenogenic heart valve: Secondary | ICD-10-CM | POA: Diagnosis not present

## 2021-05-05 DIAGNOSIS — Z952 Presence of prosthetic heart valve: Secondary | ICD-10-CM | POA: Diagnosis not present

## 2021-05-05 DIAGNOSIS — I10 Essential (primary) hypertension: Secondary | ICD-10-CM | POA: Diagnosis not present

## 2021-05-06 DIAGNOSIS — I10 Essential (primary) hypertension: Secondary | ICD-10-CM | POA: Diagnosis not present

## 2021-05-06 DIAGNOSIS — M1611 Unilateral primary osteoarthritis, right hip: Secondary | ICD-10-CM | POA: Diagnosis not present

## 2021-05-06 DIAGNOSIS — M25851 Other specified joint disorders, right hip: Secondary | ICD-10-CM | POA: Diagnosis not present

## 2021-05-06 DIAGNOSIS — M25551 Pain in right hip: Secondary | ICD-10-CM | POA: Diagnosis not present

## 2021-05-16 DIAGNOSIS — Z954 Presence of other heart-valve replacement: Secondary | ICD-10-CM | POA: Diagnosis not present

## 2021-05-16 DIAGNOSIS — I444 Left anterior fascicular block: Secondary | ICD-10-CM | POA: Diagnosis not present

## 2021-05-16 DIAGNOSIS — I459 Conduction disorder, unspecified: Secondary | ICD-10-CM | POA: Insufficient documentation

## 2021-05-16 DIAGNOSIS — E785 Hyperlipidemia, unspecified: Secondary | ICD-10-CM | POA: Diagnosis not present

## 2021-05-16 DIAGNOSIS — I4510 Unspecified right bundle-branch block: Secondary | ICD-10-CM | POA: Diagnosis not present

## 2021-05-16 DIAGNOSIS — Z950 Presence of cardiac pacemaker: Secondary | ICD-10-CM | POA: Diagnosis not present

## 2021-05-16 DIAGNOSIS — I2510 Atherosclerotic heart disease of native coronary artery without angina pectoris: Secondary | ICD-10-CM | POA: Diagnosis not present

## 2021-05-16 DIAGNOSIS — I509 Heart failure, unspecified: Secondary | ICD-10-CM | POA: Diagnosis not present

## 2021-05-16 DIAGNOSIS — B20 Human immunodeficiency virus [HIV] disease: Secondary | ICD-10-CM | POA: Diagnosis not present

## 2021-05-16 DIAGNOSIS — I440 Atrioventricular block, first degree: Secondary | ICD-10-CM | POA: Diagnosis not present

## 2021-05-16 DIAGNOSIS — Z7982 Long term (current) use of aspirin: Secondary | ICD-10-CM | POA: Diagnosis not present

## 2021-05-16 DIAGNOSIS — I110 Hypertensive heart disease with heart failure: Secondary | ICD-10-CM | POA: Diagnosis not present

## 2021-05-16 DIAGNOSIS — Z6841 Body Mass Index (BMI) 40.0 and over, adult: Secondary | ICD-10-CM | POA: Diagnosis not present

## 2021-05-16 DIAGNOSIS — J811 Chronic pulmonary edema: Secondary | ICD-10-CM | POA: Diagnosis not present

## 2021-05-16 DIAGNOSIS — I10 Essential (primary) hypertension: Secondary | ICD-10-CM | POA: Diagnosis not present

## 2021-05-16 DIAGNOSIS — Z888 Allergy status to other drugs, medicaments and biological substances status: Secondary | ICD-10-CM | POA: Diagnosis not present

## 2021-05-16 DIAGNOSIS — Z79899 Other long term (current) drug therapy: Secondary | ICD-10-CM | POA: Diagnosis not present

## 2021-05-16 DIAGNOSIS — Z7902 Long term (current) use of antithrombotics/antiplatelets: Secondary | ICD-10-CM | POA: Diagnosis not present

## 2021-05-16 DIAGNOSIS — I452 Bifascicular block: Secondary | ICD-10-CM | POA: Diagnosis not present

## 2021-05-16 DIAGNOSIS — I517 Cardiomegaly: Secondary | ICD-10-CM | POA: Diagnosis not present

## 2021-05-16 DIAGNOSIS — I442 Atrioventricular block, complete: Secondary | ICD-10-CM | POA: Diagnosis not present

## 2021-05-16 DIAGNOSIS — Z8616 Personal history of COVID-19: Secondary | ICD-10-CM | POA: Diagnosis not present

## 2021-05-16 DIAGNOSIS — E1165 Type 2 diabetes mellitus with hyperglycemia: Secondary | ICD-10-CM | POA: Diagnosis not present

## 2021-05-31 DIAGNOSIS — M25551 Pain in right hip: Secondary | ICD-10-CM | POA: Diagnosis not present

## 2021-05-31 DIAGNOSIS — Z6841 Body Mass Index (BMI) 40.0 and over, adult: Secondary | ICD-10-CM | POA: Diagnosis not present

## 2021-05-31 DIAGNOSIS — M1611 Unilateral primary osteoarthritis, right hip: Secondary | ICD-10-CM | POA: Diagnosis not present

## 2021-06-08 DIAGNOSIS — Z953 Presence of xenogenic heart valve: Secondary | ICD-10-CM | POA: Diagnosis not present

## 2021-06-17 ENCOUNTER — Ambulatory Visit (INDEPENDENT_AMBULATORY_CARE_PROVIDER_SITE_OTHER): Payer: BC Managed Care – PPO | Admitting: Family Medicine

## 2021-06-17 ENCOUNTER — Encounter: Payer: Self-pay | Admitting: Family Medicine

## 2021-06-17 VITALS — BP 161/80 | HR 94 | Ht 68.0 in | Wt 290.0 lb

## 2021-06-17 DIAGNOSIS — E1159 Type 2 diabetes mellitus with other circulatory complications: Secondary | ICD-10-CM

## 2021-06-17 DIAGNOSIS — I350 Nonrheumatic aortic (valve) stenosis: Secondary | ICD-10-CM

## 2021-06-17 DIAGNOSIS — B20 Human immunodeficiency virus [HIV] disease: Secondary | ICD-10-CM

## 2021-06-17 DIAGNOSIS — I152 Hypertension secondary to endocrine disorders: Secondary | ICD-10-CM

## 2021-06-17 DIAGNOSIS — E1165 Type 2 diabetes mellitus with hyperglycemia: Secondary | ICD-10-CM

## 2021-06-17 DIAGNOSIS — Z1211 Encounter for screening for malignant neoplasm of colon: Secondary | ICD-10-CM | POA: Diagnosis not present

## 2021-06-17 DIAGNOSIS — I459 Conduction disorder, unspecified: Secondary | ICD-10-CM

## 2021-06-17 MED ORDER — VALSARTAN 160 MG PO TABS: 160.0000 mg | ORAL_TABLET | Freq: Every day | ORAL | 1 refills | Status: DC

## 2021-06-17 MED ORDER — OZEMPIC (0.25 OR 0.5 MG/DOSE) 2 MG/1.5ML ~~LOC~~ SOPN: PEN_INJECTOR | SUBCUTANEOUS | 1 refills | Status: DC

## 2021-06-17 NOTE — Assessment & Plan Note (Signed)
Stable with current medications.  He will continue follow up with [facility].  ?

## 2021-06-17 NOTE — Assessment & Plan Note (Signed)
S/p pacemaker, doing well at this time.  ?

## 2021-06-17 NOTE — Assessment & Plan Note (Signed)
Diabetes is not well controlled at this time.  Discussed adding Ozempic.  I think will provide cardiovascular benefit as well as lessen his insulin burden.  He is open to trying this after I showed him how simple the pen is to use.  Start 0.25mg  weekly x4 weeks then increase to 0.5mg  weekly ?

## 2021-06-17 NOTE — Progress Notes (Signed)
?Clifford Strong - 71 y.o. male MRN [id number]  Date of birth: 1950-03-07 ? ?Subjective ?No chief complaint on file. ? ? ?HPI ?Clifford Strong is a 71 y.o. male here today for follow up.  Since his last visit with me he has had TAVR as well as Pacemaker for heart block.  He has done well since having this completed.  Denies dyspnea, fatigue or chest pain.   ? ?Blood sugars have not been well controlled.  He is currently taking Tresiba 120 units daily.  He has been prescribed Bydureon in the past but had trouble getting this to mix and inject so he is hesitant about adding anything additional.   ? ?BP is elevated today.  Reports that he was told by a nurse to discontinue valsartan when he had COVID but never restarted.  BP is elevated today.  ? ?Continues to see ID through [facility], however he missed his last appt due to being in the hospital.  ? ?ROS:  A comprehensive ROS was completed and negative except as noted per HPI ? ?Allergies  ?Allergen Reactions  ? Lisinopril Cough  ? ? ?Past Medical History:  ?Diagnosis Date  ? Aortic stenosis   ? Degenerative joint disease (DJD) of hip 04/09/2017  ? Diabetes mellitus without complication (HCC)   ? Heart murmur   ? HIV infection (HCC)   ? Hypertension   ? Obesity   ? ? ?Past Surgical History:  ?Procedure Laterality Date  ? AORTIC VALVE REPLACEMENT  2014  ? ? ?Social History  ? ?Socioeconomic History  ? Marital status: Married  ?  Spouse name: Not on file  ? Number of children: Not on file  ? Years of education: Not on file  ? Highest education level: Not on file  ?Occupational History  ? Not on file  ?Tobacco Use  ? Smoking status: Never  ? Smokeless tobacco: Never  ?Vaping Use  ? Vaping Use: Never used  ?Substance and Sexual Activity  ? Alcohol use: No  ? Drug use: No  ? Sexual activity: Never  ?  Partners: Female  ?Other Topics Concern  ? Not on file  ?Social History Narrative  ? Not on file  ? ?Social Determinants of Health  ? ?Financial Resource Strain: Not on file  ?Food  Insecurity: Not on file  ?Transportation Needs: Not on file  ?Physical Activity: Not on file  ?Stress: Not on file  ?Social Connections: Not on file  ? ? ?Family History  ?Problem Relation Age of Onset  ? Diabetes Mother   ? Hypertension Mother   ? Heart attack Mother   ? Diabetes Father   ? Hypertension Father   ? Lung disease Father   ? ? ?Health Maintenance  ?Topic Date Due  ? OPHTHALMOLOGY EXAM  10/04/2021 (Originally 07/06/2018)  ? FOOT EXAM  11/04/2021 (Originally 05/04/2019)  ? COVID-19 Vaccine (2 - Pfizer risk series) 11/04/2021 (Originally 06/18/2020)  ? COLON CANCER SCREENING ANNUAL FOBT  12/17/2021 (Originally 01/15/1995)  ? COLONOSCOPY (Pts 45-30yrs Insurance coverage will need to be confirmed)  06/18/2022 (Originally 01/15/1995)  ? HEMOGLOBIN A1C  09/05/2021  ? INFLUENZA VACCINE  10/04/2021  ? TETANUS/TDAP  12/06/2025  ? Pneumonia Vaccine 48+ Years old  Completed  ? Hepatitis C Screening  Completed  ? Zoster Vaccines- Shingrix  Completed  ? HPV VACCINES  Aged Out  ? ? ? ?----------------------------------------------------------------------------------------------------------------------------------------------------------------------------------------------------------------- ?Physical Exam ?BP (!) 161/80 (BP Location: Left Arm, Patient Position: Sitting, Cuff Size: Large)   Pulse 94  Ht 5\' 8"  (1.727 m)   Wt 290 lb (131.5 kg)   SpO2 99%   BMI 44.09 kg/m?  ? ?Physical Exam ?Constitutional:   ?   Appearance: Normal appearance.  ?Cardiovascular:  ?   Rate and Rhythm: Normal rate and regular rhythm.  ?Pulmonary:  ?   Effort: Pulmonary effort is normal.  ?   Breath sounds: Normal breath sounds.  ?Musculoskeletal:  ?   Cervical back: Neck supple.  ?Neurological:  ?   Mental Status: He is alert.  ?Psychiatric:     ?   Mood and Affect: Mood normal.     ?   Behavior: Behavior normal.   ? ? ?------------------------------------------------------------------------------------------------------------------------------------------------------------------------------------------------------------------- ?Assessment and Plan ? ?Heart block ?S/p pacemaker, doing well at this time.  ? ?Hypertension associated with diabetes (HCC) ?Blood pressure is not well controlled at this time.  Add valsartan back on at 160mg  daily.  BP check in 2 weeks with nurse.   ? ?Severe aortic stenosis ?S/p TAVR, doing well.  ? ?Uncontrolled type 2 diabetes mellitus with hyperglycemia (HCC) ?Diabetes is not well controlled at this time.  Discussed adding Ozempic.  I think will provide cardiovascular benefit as well as lessen his insulin burden.  He is open to trying this after I showed him how simple the pen is to use.  Start 0.25mg  weekly x4 weeks then increase to 0.5mg  weekly ? ?HIV disease (HCC) ?Stable with current medications.  He will continue follow up with [facility].  ? ? ?Meds ordered this encounter  ?Medications  ? Semaglutide,0.25 or 0.5MG /DOS, (OZEMPIC, 0.25 OR 0.5 MG/DOSE,) 2 MG/1.5ML SOPN  ?  Sig: Inject 0.25mg  weekly x4 weeks then increase to 0.5mg  weekly  ?  Dispense:  3 mL  ?  Refill:  1  ? valsartan (DIOVAN) 160 MG tablet  ?  Sig: Take 1 tablet (160 mg total) by mouth daily.  ?  Dispense:  90 tablet  ?  Refill:  1  ? ? ?Return in about 3 months (around 09/16/2021) for HTN/T2DM. ? ? ? ?This visit occurred during the SARS-CoV-2 public health emergency.  Safety protocols were in place, including screening questions prior to the visit, additional usage of staff PPE, and extensive cleaning of exam room while observing appropriate contact time as indicated for disinfecting solutions.  ? ?

## 2021-06-17 NOTE — Assessment & Plan Note (Signed)
S/p TAVR, doing well.

## 2021-06-17 NOTE — Patient Instructions (Signed)
Let's try adding ozempic once per week.  ?Continue insulin as you are currently using.  ?Restart valsartan for blood pressure.  ?Return in 2-3 weeks to have BP checked at nurse visit.  ?

## 2021-06-17 NOTE — Assessment & Plan Note (Signed)
Blood pressure is not well controlled at this time.  Add valsartan back on at 160mg  daily.  BP check in 2 weeks with nurse.   ?

## 2021-06-24 DIAGNOSIS — Z20822 Contact with and (suspected) exposure to covid-19: Secondary | ICD-10-CM | POA: Diagnosis not present

## 2021-06-24 DIAGNOSIS — M791 Myalgia, unspecified site: Secondary | ICD-10-CM | POA: Diagnosis not present

## 2021-06-24 DIAGNOSIS — J189 Pneumonia, unspecified organism: Secondary | ICD-10-CM | POA: Diagnosis not present

## 2021-06-30 DIAGNOSIS — I442 Atrioventricular block, complete: Secondary | ICD-10-CM | POA: Diagnosis not present

## 2021-07-01 ENCOUNTER — Ambulatory Visit (INDEPENDENT_AMBULATORY_CARE_PROVIDER_SITE_OTHER): Payer: BC Managed Care – PPO | Admitting: Family Medicine

## 2021-07-01 VITALS — BP 128/74 | HR 97 | Resp 18 | Ht 68.0 in | Wt 287.0 lb

## 2021-07-01 DIAGNOSIS — I10 Essential (primary) hypertension: Secondary | ICD-10-CM | POA: Diagnosis not present

## 2021-07-01 NOTE — Progress Notes (Signed)
Patient in office for nurse visit BP recheck. Patient restarted Valsartan 160mg  on 06/17/2021. Patient denies chest pains, shortness of breath, dizziness or palpitations. Patient tolerating Valsartan well with no side effects. Patient's BP today is 128/74 pulse 97. Patient has follow up appointment with PCP on 09/16/21.  ?

## 2021-07-01 NOTE — Progress Notes (Signed)
Medical screening examination/treatment was performed by qualified clinical staff member and as supervising physician I was immediately available for consultation/collaboration. I have reviewed documentation and agree with assessment and plan.  Tikesha Mort, DO  

## 2021-08-04 DIAGNOSIS — I10 Essential (primary) hypertension: Secondary | ICD-10-CM | POA: Diagnosis not present

## 2021-08-04 DIAGNOSIS — M1611 Unilateral primary osteoarthritis, right hip: Secondary | ICD-10-CM | POA: Diagnosis not present

## 2021-08-25 ENCOUNTER — Other Ambulatory Visit: Payer: Self-pay | Admitting: Family Medicine

## 2021-09-16 ENCOUNTER — Ambulatory Visit (INDEPENDENT_AMBULATORY_CARE_PROVIDER_SITE_OTHER): Payer: BC Managed Care – PPO | Admitting: Family Medicine

## 2021-09-16 ENCOUNTER — Encounter: Payer: Self-pay | Admitting: Family Medicine

## 2021-09-16 VITALS — BP 157/84 | HR 77 | Ht 68.0 in | Wt 291.0 lb

## 2021-09-16 DIAGNOSIS — E1165 Type 2 diabetes mellitus with hyperglycemia: Secondary | ICD-10-CM

## 2021-09-16 DIAGNOSIS — B20 Human immunodeficiency virus [HIV] disease: Secondary | ICD-10-CM

## 2021-09-16 DIAGNOSIS — E1159 Type 2 diabetes mellitus with other circulatory complications: Secondary | ICD-10-CM | POA: Diagnosis not present

## 2021-09-16 DIAGNOSIS — I152 Hypertension secondary to endocrine disorders: Secondary | ICD-10-CM

## 2021-09-16 LAB — POCT GLYCOSYLATED HEMOGLOBIN (HGB A1C): HbA1c, POC (controlled diabetic range): 7 % (ref 0.0–7.0)

## 2021-09-16 MED ORDER — SEMAGLUTIDE (1 MG/DOSE) 4 MG/3ML ~~LOC~~ SOPN: 1.0000 mg | PEN_INJECTOR | SUBCUTANEOUS | 1 refills | Status: DC

## 2021-09-16 MED ORDER — VALSARTAN 320 MG PO TABS: 320.0000 mg | ORAL_TABLET | Freq: Every day | ORAL | 2 refills | Status: DC

## 2021-09-16 NOTE — Patient Instructions (Addendum)
Increase ozempic to 1mg  weekly.  New prescription has been sent in.  Split tresiba to 60 units in the morning and 65 units in the evening.   Work on dietary changes.   Increase valsartan to 320mg  daily.  2-3 week nurse visit for BP check.

## 2021-09-16 NOTE — Assessment & Plan Note (Signed)
Management per ID.  Stable at this time. 

## 2021-09-16 NOTE — Assessment & Plan Note (Signed)
BP remains elevated.  Increasing valsartan to 320mg .  Return in 2 weeks for nurse visit-BP check.  F/u in 3 months with me.

## 2021-09-16 NOTE — Assessment & Plan Note (Signed)
A1c has improved.  Will continue to titrate Ozempic to 1mg  weekly.  Split tresiba into BID dosing, 60 units qam and 65 units qpm.

## 2021-09-16 NOTE — Progress Notes (Signed)
Clifford Strong - 71 y.o. male MRN [id number]  Date of birth: May 10, 1950  Subjective No chief complaint on file.   HPI Clifford Strong is a 71 y.o. male here today for follow up visit.   Continues on Ozempic at 0.5mg  weekly as well as tresiba for management of diabetes.  Blood sugars at home have been better controlled.   Weight is actually up some since last visit.    He is not very active due to arthritis in his hip.    BP is elevated today.  He is taking valsartan, carvedilol and furosemide.  He denies symptoms related to his BP including chest pain, shortness of breath, palpitations, headache or vision changes.  Continues to see ID for management of HIV.  Stable at this time.    ROS:  A comprehensive ROS was completed and negative except as noted per HPI   Allergies  Allergen Reactions   Lisinopril Cough    Past Medical History:  Diagnosis Date   Aortic stenosis    Degenerative joint disease (DJD) of hip 04/09/2017   Diabetes mellitus without complication (HCC)    Heart murmur    HIV infection (HCC)    Hypertension    Obesity     Past Surgical History:  Procedure Laterality Date   AORTIC VALVE REPLACEMENT  2014    Social History   Socioeconomic History   Marital status: Married    Spouse name: Not on file   Number of children: Not on file   Years of education: Not on file   Highest education level: Not on file  Occupational History   Not on file  Tobacco Use   Smoking status: Never   Smokeless tobacco: Never  Vaping Use   Vaping Use: Never used  Substance and Sexual Activity   Alcohol use: No   Drug use: No   Sexual activity: Never    Partners: Female  Other Topics Concern   Not on file  Social History Narrative   Not on file   Social Determinants of Health   Financial Resource Strain: Not on file  Food Insecurity: Not on file  Transportation Needs: Not on file  Physical Activity: Not on file  Stress: Not on file  Social Connections: Not on file     Family History  Problem Relation Age of Onset   Diabetes Mother    Hypertension Mother    Heart attack Mother    Diabetes Father    Hypertension Father    Lung disease Father     Health Maintenance  Topic Date Due   OPHTHALMOLOGY EXAM  10/04/2021 (Originally 07/06/2018)   FOOT EXAM  11/04/2021 (Originally 05/04/2019)   COVID-19 Vaccine (2 - Pfizer risk series) 11/04/2021 (Originally 06/18/2020)   Diabetic kidney evaluation - Urine ACR  12/16/2021 (Originally 04/20/2019)   COLON CANCER SCREENING ANNUAL FOBT  12/17/2021 (Originally 01/15/1995)   COLONOSCOPY (Pts 45-72yrs Insurance coverage will need to be confirmed)  06/18/2022 (Originally 01/15/1995)   INFLUENZA VACCINE  10/04/2021   Diabetic kidney evaluation - GFR measurement  03/08/2022   HEMOGLOBIN A1C  03/19/2022   TETANUS/TDAP  12/06/2025   Pneumonia Vaccine 49+ Years old  Completed   Hepatitis C Screening  Completed   Zoster Vaccines- Shingrix  Completed   HPV VACCINES  Aged Out     ----------------------------------------------------------------------------------------------------------------------------------------------------------------------------------------------------------------- Physical Exam BP (!) 179/94 (BP Location: Left Arm, Patient Position: Sitting)   Pulse 76   Ht 5\' 8"  (1.727 m)   Wt 291 lb (132  kg)   SpO2 94%   BMI 44.25 kg/m   Physical Exam Constitutional:      Appearance: Normal appearance.  Eyes:     General: No scleral icterus. Cardiovascular:     Rate and Rhythm: Normal rate and regular rhythm.  Pulmonary:     Effort: Pulmonary effort is normal.     Breath sounds: Normal breath sounds.  Musculoskeletal:     Cervical back: Neck supple.  Neurological:     Mental Status: He is alert.  Psychiatric:        Mood and Affect: Mood normal.        Behavior: Behavior normal.      ------------------------------------------------------------------------------------------------------------------------------------------------------------------------------------------------------------------- Assessment and Plan  Hypertension associated with diabetes (HCC) BP remains elevated.  Increasing valsartan to 320mg .  Return in 2 weeks for nurse visit-BP check.  F/u in 3 months with me.   Uncontrolled type 2 diabetes mellitus with hyperglycemia (HCC) A1c has improved.  Will continue to titrate Ozempic to 1mg  weekly.  Split tresiba into BID dosing, 60 units qam and 65 units qpm.   HIV disease (HCC) Management per ID.  Stable at this time.    Meds ordered this encounter  Medications   Semaglutide, 1 MG/DOSE, 4 MG/3ML SOPN    Sig: Inject 1 mg as directed once a week.    Dispense:  9 mL    Refill:  1   valsartan (DIOVAN) 320 MG tablet    Sig: Take 1 tablet (320 mg total) by mouth daily.    Dispense:  90 tablet    Refill:  2    Return in about 3 months (around 12/17/2021) for T2DM/HTN.    This visit occurred during the SARS-CoV-2 public health emergency.  Safety protocols were in place, including screening questions prior to the visit, additional usage of staff PPE, and extensive cleaning of exam room while observing appropriate contact time as indicated for disinfecting solutions.

## 2021-09-30 ENCOUNTER — Ambulatory Visit (INDEPENDENT_AMBULATORY_CARE_PROVIDER_SITE_OTHER): Payer: BC Managed Care – PPO | Admitting: Family Medicine

## 2021-09-30 VITALS — BP 133/81 | HR 77

## 2021-09-30 DIAGNOSIS — I10 Essential (primary) hypertension: Secondary | ICD-10-CM

## 2021-09-30 NOTE — Progress Notes (Unsigned)
   Established Patient Office Visit  Subjective   Patient ID: Clifford Strong, male    DOB: 1950-07-23  Age: 71 y.o. MRN: [id number]  Chief Complaint  Patient presents with   Hypertension    HPI  Clifford Strong is here for blood pressure check. Denies chest pain, shortness of breath or dizziness.   ROS    Objective:     BP 133/81   Pulse 77   SpO2 97%  {Vitals History (Optional):23777}  Physical Exam   No results found for any visits on 09/30/21.  {Labs (Optional):23779}  The ASCVD Risk score (Arnett DK, et al., 2019) failed to calculate for the following reasons:   The valid total cholesterol range is 130 to 320 mg/dL    Assessment & Plan:  Hypertension - Blood pressure within normal limits. Patient advised to continue current medications as directed and to follow up in 3 months with Dr Ashley Royalty.   Problem List Items Addressed This Visit   None Visit Diagnoses     Essential hypertension    -  Primary       Return in about 3 months (around 12/31/2021) for HTN with Dr Ashley Royalty. Earna Coder, Janalyn Harder, CMA

## 2021-10-02 NOTE — Progress Notes (Signed)
Medical screening examination/treatment was performed by qualified clinical staff member and as supervising physician I was immediately available for consultation/collaboration. I have reviewed documentation and agree with assessment and plan.  Destiney Sanabia, DO  

## 2021-10-04 DIAGNOSIS — I442 Atrioventricular block, complete: Secondary | ICD-10-CM | POA: Diagnosis not present

## 2021-10-06 DIAGNOSIS — M1611 Unilateral primary osteoarthritis, right hip: Secondary | ICD-10-CM | POA: Diagnosis not present

## 2021-10-06 DIAGNOSIS — I10 Essential (primary) hypertension: Secondary | ICD-10-CM | POA: Diagnosis not present

## 2021-10-14 DIAGNOSIS — Z952 Presence of prosthetic heart valve: Secondary | ICD-10-CM | POA: Diagnosis not present

## 2021-10-14 DIAGNOSIS — Z950 Presence of cardiac pacemaker: Secondary | ICD-10-CM | POA: Diagnosis not present

## 2021-10-14 DIAGNOSIS — I10 Essential (primary) hypertension: Secondary | ICD-10-CM | POA: Diagnosis not present

## 2021-11-11 ENCOUNTER — Telehealth: Payer: Self-pay

## 2021-11-11 ENCOUNTER — Other Ambulatory Visit: Payer: Self-pay

## 2021-11-11 DIAGNOSIS — E1165 Type 2 diabetes mellitus with hyperglycemia: Secondary | ICD-10-CM

## 2021-11-11 MED ORDER — TRESIBA FLEXTOUCH 200 UNIT/ML ~~LOC~~ SOPN: PEN_INJECTOR | SUBCUTANEOUS | 1 refills | Status: DC

## 2021-11-11 NOTE — Telephone Encounter (Signed)
I scheduled the pt for 12/14/2021 @ 2:20. Tvt

## 2021-11-17 DIAGNOSIS — B20 Human immunodeficiency virus [HIV] disease: Secondary | ICD-10-CM | POA: Diagnosis not present

## 2021-11-17 DIAGNOSIS — Z953 Presence of xenogenic heart valve: Secondary | ICD-10-CM | POA: Diagnosis not present

## 2021-11-17 DIAGNOSIS — E118 Type 2 diabetes mellitus with unspecified complications: Secondary | ICD-10-CM | POA: Diagnosis not present

## 2021-11-17 DIAGNOSIS — I10 Essential (primary) hypertension: Secondary | ICD-10-CM | POA: Diagnosis not present

## 2021-12-14 ENCOUNTER — Ambulatory Visit: Payer: BC Managed Care – PPO | Admitting: Family Medicine

## 2021-12-23 ENCOUNTER — Ambulatory Visit: Payer: BC Managed Care – PPO | Admitting: Family Medicine

## 2021-12-28 DIAGNOSIS — Z7982 Long term (current) use of aspirin: Secondary | ICD-10-CM | POA: Diagnosis not present

## 2021-12-28 DIAGNOSIS — I712 Thoracic aortic aneurysm, without rupture, unspecified: Secondary | ICD-10-CM | POA: Diagnosis not present

## 2021-12-28 DIAGNOSIS — I110 Hypertensive heart disease with heart failure: Secondary | ICD-10-CM | POA: Diagnosis not present

## 2021-12-28 DIAGNOSIS — I5033 Acute on chronic diastolic (congestive) heart failure: Secondary | ICD-10-CM | POA: Diagnosis not present

## 2021-12-28 DIAGNOSIS — E785 Hyperlipidemia, unspecified: Secondary | ICD-10-CM | POA: Diagnosis not present

## 2021-12-28 DIAGNOSIS — Z79899 Other long term (current) drug therapy: Secondary | ICD-10-CM | POA: Diagnosis not present

## 2021-12-28 DIAGNOSIS — I7121 Aneurysm of the ascending aorta, without rupture: Secondary | ICD-10-CM | POA: Diagnosis not present

## 2021-12-28 DIAGNOSIS — R0602 Shortness of breath: Secondary | ICD-10-CM | POA: Diagnosis not present

## 2021-12-28 DIAGNOSIS — R0600 Dyspnea, unspecified: Secondary | ICD-10-CM | POA: Diagnosis not present

## 2021-12-28 DIAGNOSIS — Z21 Asymptomatic human immunodeficiency virus [HIV] infection status: Secondary | ICD-10-CM | POA: Diagnosis not present

## 2021-12-28 DIAGNOSIS — Z7902 Long term (current) use of antithrombotics/antiplatelets: Secondary | ICD-10-CM | POA: Diagnosis not present

## 2021-12-28 DIAGNOSIS — I2510 Atherosclerotic heart disease of native coronary artery without angina pectoris: Secondary | ICD-10-CM | POA: Diagnosis not present

## 2021-12-28 DIAGNOSIS — R0789 Other chest pain: Secondary | ICD-10-CM | POA: Diagnosis not present

## 2021-12-28 DIAGNOSIS — I4510 Unspecified right bundle-branch block: Secondary | ICD-10-CM | POA: Diagnosis not present

## 2021-12-28 DIAGNOSIS — I5043 Acute on chronic combined systolic (congestive) and diastolic (congestive) heart failure: Secondary | ICD-10-CM | POA: Diagnosis not present

## 2021-12-28 DIAGNOSIS — Z20822 Contact with and (suspected) exposure to covid-19: Secondary | ICD-10-CM | POA: Diagnosis not present

## 2021-12-28 DIAGNOSIS — E119 Type 2 diabetes mellitus without complications: Secondary | ICD-10-CM | POA: Diagnosis not present

## 2021-12-28 DIAGNOSIS — I440 Atrioventricular block, first degree: Secondary | ICD-10-CM | POA: Diagnosis not present

## 2021-12-28 DIAGNOSIS — Z794 Long term (current) use of insulin: Secondary | ICD-10-CM | POA: Diagnosis not present

## 2021-12-28 DIAGNOSIS — R7989 Other specified abnormal findings of blood chemistry: Secondary | ICD-10-CM | POA: Diagnosis not present

## 2021-12-29 DIAGNOSIS — I5030 Unspecified diastolic (congestive) heart failure: Secondary | ICD-10-CM | POA: Insufficient documentation

## 2021-12-29 DIAGNOSIS — I459 Conduction disorder, unspecified: Secondary | ICD-10-CM | POA: Diagnosis not present

## 2021-12-29 DIAGNOSIS — E118 Type 2 diabetes mellitus with unspecified complications: Secondary | ICD-10-CM | POA: Diagnosis not present

## 2021-12-29 DIAGNOSIS — Z952 Presence of prosthetic heart valve: Secondary | ICD-10-CM | POA: Diagnosis not present

## 2021-12-29 DIAGNOSIS — I10 Essential (primary) hypertension: Secondary | ICD-10-CM | POA: Diagnosis not present

## 2021-12-29 DIAGNOSIS — Z953 Presence of xenogenic heart valve: Secondary | ICD-10-CM | POA: Diagnosis not present

## 2021-12-30 ENCOUNTER — Encounter: Payer: Self-pay | Admitting: Family Medicine

## 2021-12-30 ENCOUNTER — Telehealth: Payer: Self-pay | Admitting: General Practice

## 2021-12-30 ENCOUNTER — Ambulatory Visit (INDEPENDENT_AMBULATORY_CARE_PROVIDER_SITE_OTHER): Payer: BC Managed Care – PPO | Admitting: Family Medicine

## 2021-12-30 VITALS — BP 119/76 | HR 85 | Ht 68.0 in | Wt 285.0 lb

## 2021-12-30 DIAGNOSIS — Z23 Encounter for immunization: Secondary | ICD-10-CM | POA: Diagnosis not present

## 2021-12-30 DIAGNOSIS — B20 Human immunodeficiency virus [HIV] disease: Secondary | ICD-10-CM

## 2021-12-30 DIAGNOSIS — E1159 Type 2 diabetes mellitus with other circulatory complications: Secondary | ICD-10-CM

## 2021-12-30 DIAGNOSIS — Z1211 Encounter for screening for malignant neoplasm of colon: Secondary | ICD-10-CM

## 2021-12-30 DIAGNOSIS — I152 Hypertension secondary to endocrine disorders: Secondary | ICD-10-CM

## 2021-12-30 DIAGNOSIS — E1165 Type 2 diabetes mellitus with hyperglycemia: Secondary | ICD-10-CM

## 2021-12-30 LAB — POCT GLYCOSYLATED HEMOGLOBIN (HGB A1C): HbA1c, POC (controlled diabetic range): 6.1 % (ref 0.0–7.0)

## 2021-12-30 MED ORDER — SEMAGLUTIDE (2 MG/DOSE) 8 MG/3ML ~~LOC~~ SOPN: 2.0000 mg | PEN_INJECTOR | SUBCUTANEOUS | 3 refills | Status: DC

## 2021-12-30 NOTE — Patient Instructions (Addendum)
Increase Ozempic to 2mg  weekly.  Reduce tresiba by 5 units.  If having low blood sugar, reduce by another 5 units.  See me again in 3-4 months.

## 2021-12-30 NOTE — Assessment & Plan Note (Signed)
Management per ID.  Stable at this time.

## 2021-12-30 NOTE — Progress Notes (Signed)
Clifford Strong - 71 y.o. male MRN [id number]  Date of birth: 12-Sep-1950  Subjective Chief Complaint  Patient presents with   Hospitalization Follow-up    HPI Clifford Strong is a 71 y.o. male here today for follow up visit.   Seen in ED earlier this week for shortness of breath.  Noted to have increasing size of thoracic aneurysm and has been referred to vascular surgery.   Lasix added at cardiology visit yesterday as well.  Shortness of breath has improved some since starting lasix.  Ozempic added previously in addition to tresiba for management of diabetes.  Tolerating well without side effects.  He has not had significant side effects from medication.  He denies hypoglycemia with current combination.  Weight is down about 5lbs since last visit.   History of TAVR. Continues to follow with cardiology.  BP remains well controlled with valsartan and coreg.    Continues to see infectious disease regularly and remains on Biktarvy.    ROS:  A comprehensive ROS was completed and negative except as noted per HPI    Allergies  Allergen Reactions   Lisinopril Cough    Past Medical History:  Diagnosis Date   Aortic stenosis    Degenerative joint disease (DJD) of hip 04/09/2017   Diabetes mellitus without complication (HCC)    Heart murmur    HIV infection ([place])    Hypertension    Obesity     Past Surgical History:  Procedure Laterality Date   AORTIC VALVE REPLACEMENT  2014    Social History   Socioeconomic History   Marital status: Married    Spouse name: Not on file   Number of children: Not on file   Years of education: Not on file   Highest education level: Not on file  Occupational History   Not on file  Tobacco Use   Smoking status: Never   Smokeless tobacco: Never  Vaping Use   Vaping Use: Never used  Substance and Sexual Activity   Alcohol use: No   Drug use: No   Sexual activity: Never    Partners: Female  Other Topics Concern   Not on file  Social History  Narrative   Not on file   Social Determinants of Health   Financial Resource Strain: Not on file  Food Insecurity: Not on file  Transportation Needs: Not on file  Physical Activity: Not on file  Stress: Not on file  Social Connections: Not on file    Family History  Problem Relation Age of Onset   Diabetes Mother    Hypertension Mother    Heart attack Mother    Diabetes Father    Hypertension Father    Lung disease Father     Health Maintenance  Topic Date Due   Diabetic kidney evaluation - Urine ACR  04/06/2022 (Originally 04/20/2019)   FOOT EXAM  04/06/2022 (Originally 05/04/2019)   [place]  04/06/2022 (Originally 07/06/2018)   COVID-19 Vaccine (2 - Pfizer risk series) 04/06/2022 (Originally 06/18/2020)   Medicare Annual Wellness (AWV)  04/06/2022 (Originally 04/04/2018)   COLONOSCOPY (Pts 45-9yrs Insurance coverage will need to be confirmed)  06/18/2022 (Originally 01/15/1995)   [place] FOBT  07/01/2022 (Originally 01/15/1995)   Diabetic kidney evaluation - GFR measurement  03/08/2022   HEMOGLOBIN A1C  07/01/2022   TETANUS/TDAP  12/06/2025   Pneumonia Vaccine 52+ Years old  Completed   INFLUENZA VACCINE  Completed   Hepatitis C Screening  Completed   Zoster Vaccines-  Shingrix  Completed   HPV VACCINES  Aged Out     ----------------------------------------------------------------------------------------------------------------------------------------------------------------------------------------------------------------- Physical Exam BP 119/76 (BP Location: Left Arm, Patient Position: Sitting, Cuff Size: Large)   Pulse 85   Ht 5\' 8"  (1.727 m)   Wt 285 lb (129.3 kg)   SpO2 97%   BMI 43.33 kg/m   Physical Exam Constitutional:      Appearance: Normal appearance.  Eyes:     General: No scleral icterus. Cardiovascular:     Rate and Rhythm: Normal rate and regular rhythm.  Pulmonary:     Effort: Pulmonary effort is normal.      Breath sounds: Normal breath sounds.  Musculoskeletal:     Cervical back: Neck supple.  Neurological:     General: No focal deficit present.     Mental Status: He is alert.  Psychiatric:        Mood and Affect: Mood normal.        Behavior: Behavior normal.     ------------------------------------------------------------------------------------------------------------------------------------------------------------------------------------------------------------------- Assessment and Plan  HIV disease (HCC) Management per ID.  Stable at this time.  Hypertension associated with diabetes (HCC) BP is well controlled at this time.  Has has increased dyspnea and lasix added.  He will continue current medications.    Uncontrolled type 2 diabetes mellitus with hyperglycemia (HCC) Control of blood sugars have improved. Titrate ozempic to 2mg .  Reduce tresiba by 5 units.  May reduce tresiba further if needed if having symptoms of hypoglycemia.  Given sample of freestyle libre.  He will let me know if he decides to try this.    Meds ordered this encounter  Medications   Semaglutide, 2 MG/DOSE, 8 MG/3ML SOPN    Sig: Inject 2 mg as directed once a week.    Dispense:  3 mL    Refill:  3    Return in about 3 months (around 04/01/2022) for F/u T2DM.    This visit occurred during the SARS-CoV-2 public health emergency.  Safety protocols were in place, including screening questions prior to the visit, additional usage of staff PPE, and extensive cleaning of exam room while observing appropriate contact time as indicated for disinfecting solutions.

## 2021-12-30 NOTE — Assessment & Plan Note (Signed)
Control of blood sugars have improved. Titrate ozempic to 2mg .  Reduce tresiba by 5 units.  May reduce tresiba further if needed if having symptoms of hypoglycemia.  Given sample of freestyle libre.  He will let me know if he decides to try this.

## 2021-12-30 NOTE — Telephone Encounter (Signed)
Transition Care Management Follow-up Telephone Call Date of discharge and from where: 12/28/21 from [facility] How have you been since you were released from the hospital? Patient had OV with PCP on 12/30/21 Any questions or concerns? No

## 2021-12-30 NOTE — Assessment & Plan Note (Signed)
BP is well controlled at this time.  Has has increased dyspnea and lasix added.  He will continue current medications.

## 2022-01-10 DIAGNOSIS — I442 Atrioventricular block, complete: Secondary | ICD-10-CM | POA: Diagnosis not present

## 2022-01-12 DIAGNOSIS — I10 Essential (primary) hypertension: Secondary | ICD-10-CM | POA: Diagnosis not present

## 2022-01-12 DIAGNOSIS — I2510 Atherosclerotic heart disease of native coronary artery without angina pectoris: Secondary | ICD-10-CM | POA: Diagnosis not present

## 2022-01-12 DIAGNOSIS — Z953 Presence of xenogenic heart valve: Secondary | ICD-10-CM | POA: Diagnosis not present

## 2022-01-12 DIAGNOSIS — I5030 Unspecified diastolic (congestive) heart failure: Secondary | ICD-10-CM | POA: Diagnosis not present

## 2022-01-12 DIAGNOSIS — R0609 Other forms of dyspnea: Secondary | ICD-10-CM | POA: Diagnosis not present

## 2022-01-19 ENCOUNTER — Encounter: Payer: Self-pay | Admitting: Family Medicine

## 2022-01-19 ENCOUNTER — Ambulatory Visit (INDEPENDENT_AMBULATORY_CARE_PROVIDER_SITE_OTHER): Payer: BC Managed Care – PPO | Admitting: Family Medicine

## 2022-01-19 VITALS — BP 115/65 | HR 76 | Ht 68.0 in | Wt 279.8 lb

## 2022-01-19 DIAGNOSIS — I152 Hypertension secondary to endocrine disorders: Secondary | ICD-10-CM | POA: Diagnosis not present

## 2022-01-19 DIAGNOSIS — Z23 Encounter for immunization: Secondary | ICD-10-CM | POA: Diagnosis not present

## 2022-01-19 DIAGNOSIS — Z794 Long term (current) use of insulin: Secondary | ICD-10-CM | POA: Diagnosis not present

## 2022-01-19 DIAGNOSIS — E1169 Type 2 diabetes mellitus with other specified complication: Secondary | ICD-10-CM | POA: Diagnosis not present

## 2022-01-19 DIAGNOSIS — E1165 Type 2 diabetes mellitus with hyperglycemia: Secondary | ICD-10-CM

## 2022-01-19 DIAGNOSIS — E1159 Type 2 diabetes mellitus with other circulatory complications: Secondary | ICD-10-CM | POA: Diagnosis not present

## 2022-01-19 DIAGNOSIS — E559 Vitamin D deficiency, unspecified: Secondary | ICD-10-CM

## 2022-01-19 DIAGNOSIS — Z952 Presence of prosthetic heart valve: Secondary | ICD-10-CM | POA: Diagnosis not present

## 2022-01-19 DIAGNOSIS — E785 Hyperlipidemia, unspecified: Secondary | ICD-10-CM

## 2022-01-19 DIAGNOSIS — R351 Nocturia: Secondary | ICD-10-CM

## 2022-01-19 DIAGNOSIS — B20 Human immunodeficiency virus [HIV] disease: Secondary | ICD-10-CM | POA: Diagnosis not present

## 2022-01-19 DIAGNOSIS — Z0000 Encounter for general adult medical examination without abnormal findings: Secondary | ICD-10-CM | POA: Diagnosis not present

## 2022-01-19 DIAGNOSIS — I10 Essential (primary) hypertension: Secondary | ICD-10-CM | POA: Diagnosis not present

## 2022-01-19 NOTE — Assessment & Plan Note (Signed)
Well adult Orders Placed This Encounter  Procedures   COMPLETE METABOLIC PANEL WITH GFR   CBC with Differential   Lipid Panel w/reflex Direct LDL   Urine Microalbumin w/creat. ratio   PSA   Vitamin D (25 hydroxy)  Screenings: per lab orders.  Reminded to complete cologuard Immunizations:  Declines COVID vaccine.  Recommend RSV vaccine from pharmacy.  Anticipatory guidance/Risk factor reduction:  Recommendations per AVS.

## 2022-01-19 NOTE — Progress Notes (Signed)
Clifford Strong - 71 y.o. male MRN [id number]  Date of birth: 1951-01-29  Subjective Chief Complaint  Patient presents with  . Annual Exam    HPI Clifford Strong is a 71 y.o. male here today for annual exam.   Reports he is feeling well at this time.  He does not have new concerns today.  He did receive cologuard but has not completed this yet.  Doing well with current medications.  Breathing has improved with addition of lasix previously.   He is not very physically active.  He has been working on dietary change, Ozempic has helped with this as well.   He is a non-smoker.  He denies EtOH use at this time.   He declines COVID vaccine.   Review of Systems  Constitutional:  Negative for chills, fever, malaise/fatigue and weight loss.  HENT:  Negative for congestion, ear pain and sore throat.   Eyes:  Negative for blurred vision, double vision and pain.  Respiratory:  Negative for cough and shortness of breath.   Cardiovascular:  Negative for chest pain and palpitations.  Gastrointestinal:  Negative for abdominal pain, blood in stool, constipation, heartburn and nausea.  Genitourinary:  Negative for dysuria and urgency.  Musculoskeletal:  Negative for joint pain and myalgias.  Neurological:  Negative for dizziness and headaches.  Endo/Heme/Allergies:  Does not bruise/bleed easily.  Psychiatric/Behavioral:  Negative for depression. The patient is not nervous/anxious and does not have insomnia.         Allergies  Allergen Reactions  . Lisinopril Cough    Past Medical History:  Diagnosis Date  . Aortic stenosis   . Degenerative joint disease (DJD) of hip 04/09/2017  . Diabetes mellitus without complication (HCC)   . Heart murmur   . HIV infection (HCC)   . Hypertension   . Obesity     Past Surgical History:  Procedure Laterality Date  . AORTIC VALVE REPLACEMENT  2014    Social History   Socioeconomic History  . Marital status: Married    Spouse name: Not on file  .  Number of children: Not on file  . Years of education: Not on file  . Highest education level: Not on file  Occupational History  . Not on file  Tobacco Use  . Smoking status: Never  . Smokeless tobacco: Never  Vaping Use  . Vaping Use: Never used  Substance and Sexual Activity  . Alcohol use: No  . Drug use: No  . Sexual activity: Never    Partners: Female  Other Topics Concern  . Not on file  Social History Narrative  . Not on file   Social Determinants of Health   Financial Resource Strain: Not on file  Food Insecurity: Not on file  Transportation Needs: Not on file  Physical Activity: Not on file  Stress: Not on file  Social Connections: Not on file    Family History  Problem Relation Age of Onset  . Diabetes Mother   . Hypertension Mother   . Heart attack Mother   . Diabetes Father   . Hypertension Father   . Lung disease Father     Health Maintenance  Topic Date Due  . Fecal DNA (Cologuard)  Never done  . Diabetic kidney evaluation - Urine ACR  04/06/2022 (Originally 04/20/2019)  . FOOT EXAM  04/06/2022 (Originally 05/04/2019)  . OPHTHALMOLOGY EXAM  04/06/2022 (Originally 07/06/2018)  . COVID-19 Vaccine (2 - Pfizer risk series) 04/06/2022 (Originally 06/18/2020)  . Medicare Annual Wellness (  AWV)  04/06/2022 (Originally 04/04/2018)  . Diabetic kidney evaluation - GFR measurement  03/08/2022  . HEMOGLOBIN A1C  07/01/2022  . TETANUS/TDAP  12/06/2025  . Pneumonia Vaccine 8+ Years old  Completed  . INFLUENZA VACCINE  Completed  . Hepatitis C Screening  Completed  . Zoster Vaccines- Shingrix  Completed  . HPV VACCINES  Aged Out     ----------------------------------------------------------------------------------------------------------------------------------------------------------------------------------------------------------------- Physical Exam BP 115/65 (BP Location: Left Arm, Patient Position: Sitting, Cuff Size: Large)   Pulse 76   Ht 5\' 8"  (1.727  m)   Wt 279 lb 12.8 oz (126.9 kg)   SpO2 100%   BMI 42.54 kg/m   Physical Exam Constitutional:      General: He is not in acute distress. HENT:     Head: Normocephalic and atraumatic.     Right Ear: Tympanic membrane and external ear normal.     Left Ear: Tympanic membrane and external ear normal.  Eyes:     General: No scleral icterus. Neck:     Thyroid: No thyromegaly.  Cardiovascular:     Rate and Rhythm: Normal rate and regular rhythm.     Heart sounds: Normal heart sounds.  Pulmonary:     Effort: Pulmonary effort is normal.     Breath sounds: Normal breath sounds.  Abdominal:     General: Bowel sounds are normal. There is no distension.     Palpations: Abdomen is soft.     Tenderness: There is no abdominal tenderness. There is no guarding.  Musculoskeletal:     Cervical back: Normal range of motion.  Lymphadenopathy:     Cervical: No cervical adenopathy.  Skin:    General: Skin is warm and dry.     Findings: No rash.  Neurological:     Mental Status: He is alert and oriented to person, place, and time.     Cranial Nerves: No cranial nerve deficit.     Motor: No abnormal muscle tone.  Psychiatric:        Mood and Affect: Mood normal.        Behavior: Behavior normal.    ------------------------------------------------------------------------------------------------------------------------------------------------------------------------------------------------------------------- Assessment and Plan  Well adult exam Well adult Orders Placed This Encounter  Procedures  . COMPLETE METABOLIC PANEL WITH GFR  . CBC with Differential  . Lipid Panel w/reflex Direct LDL  . Urine Microalbumin w/creat. ratio  . PSA  . Vitamin D (25 hydroxy)  Screenings: per lab orders.  Reminded to complete cologuard Immunizations:  Declines COVID vaccine.  Recommend RSV vaccine from pharmacy.  Anticipatory guidance/Risk factor reduction:  Recommendations per AVS.    No orders of  the defined types were placed in this encounter.   No follow-ups on file.    This visit occurred during the SARS-CoV-2 public health emergency.  Safety protocols were in place, including screening questions prior to the visit, additional usage of staff PPE, and extensive cleaning of exam room while observing appropriate contact time as indicated for disinfecting solutions.

## 2022-01-19 NOTE — Patient Instructions (Signed)
Preventive Care 65 Years and Older, Male Preventive care refers to lifestyle choices and visits with your health care provider that can promote health and wellness. Preventive care visits are also called wellness exams. What can I expect for my preventive care visit? Counseling During your preventive care visit, your health care provider may ask about your: Medical history, including: Past medical problems. Family medical history. History of falls. Current health, including: Emotional well-being. Home life and relationship well-being. Sexual activity. Memory and ability to understand (cognition). Lifestyle, including: Alcohol, nicotine or tobacco, and drug use. Access to firearms. Diet, exercise, and sleep habits. Work and work environment. Sunscreen use. Safety issues such as seatbelt and bike helmet use. Physical exam Your health care provider will check your: Height and weight. These may be used to calculate your BMI (body mass index). BMI is a measurement that tells if you are at a healthy weight. Waist circumference. This measures the distance around your waistline. This measurement also tells if you are at a healthy weight and may help predict your risk of certain diseases, such as type 2 diabetes and high blood pressure. Heart rate and blood pressure. Body temperature. Skin for abnormal spots. What immunizations do I need?  Vaccines are usually given at various ages, according to a schedule. Your health care provider will recommend vaccines for you based on your age, medical history, and lifestyle or other factors, such as travel or where you work. What tests do I need? Screening Your health care provider may recommend screening tests for certain conditions. This may include: Lipid and cholesterol levels. Diabetes screening. This is done by checking your blood sugar (glucose) after you have not eaten for a while (fasting). Hepatitis C test. Hepatitis B test. HIV (human  immunodeficiency virus) test. STI (sexually transmitted infection) testing, if you are at risk. Lung cancer screening. Colorectal cancer screening. Prostate cancer screening. Abdominal aortic aneurysm (AAA) screening. You may need this if you are a current or former smoker. Talk with your health care provider about your test results, treatment options, and if necessary, the need for more tests. Follow these instructions at home: Eating and drinking  Eat a diet that includes fresh fruits and vegetables, whole grains, lean protein, and low-fat dairy products. Limit your intake of foods with high amounts of sugar, saturated fats, and salt. Take vitamin and mineral supplements as recommended by your health care provider. Do not drink alcohol if your health care provider tells you not to drink. If you drink alcohol: Limit how much you have to 0-2 drinks a day. Know how much alcohol is in your drink. In the [place] one drink equals one 12 oz bottle of beer (355 mL), one 5 oz glass of wine (148 mL), or one 1 oz glass of hard liquor (44 mL). Lifestyle Brush your teeth every morning and night with fluoride toothpaste. Floss one time each day. Exercise for at least 30 minutes 5 or more days each week. Do not use any products that contain nicotine or tobacco. These products include cigarettes, chewing tobacco, and vaping devices, such as e-cigarettes. If you need help quitting, ask your health care provider. Do not use drugs. If you are sexually active, practice safe sex. Use a condom or other form of protection to prevent STIs. Take aspirin only as told by your health care provider. Make sure that you understand how much to take and what form to take. Work with your health care provider to find out whether it is safe   and beneficial for you to take aspirin daily. Ask your health care provider if you need to take a cholesterol-lowering medicine (statin). Find healthy ways to manage stress, such  as: Meditation, yoga, or listening to music. Journaling. Talking to a trusted person. Spending time with friends and family. Safety Always wear your seat belt while driving or riding in a vehicle. Do not drive: If you have been drinking alcohol. Do not ride with someone who has been drinking. When you are tired or distracted. While texting. If you have been using any mind-altering substances or drugs. Wear a helmet and other protective equipment during sports activities. If you have firearms in your house, make sure you follow all gun safety procedures. Minimize exposure to UV radiation to reduce your risk of skin cancer. What's next? Visit your health care provider once a year for an annual wellness visit. Ask your health care provider how often you should have your eyes and teeth checked. Stay up to date on all vaccines. This information is not intended to replace advice given to you by your health care provider. Make sure you discuss any questions you have with your health care provider. Document Revised: 08/18/2020 Document Reviewed: 08/18/2020 Elsevier Patient Education  2023 Elsevier Inc.  

## 2022-01-20 LAB — COMPLETE METABOLIC PANEL WITH GFR
AG Ratio: 1.3 (calc) (ref 1.0–2.5)
ALT: 28 U/L (ref 9–46)
AST: 23 U/L (ref 10–35)
Albumin: 4.3 g/dL (ref 3.6–5.1)
Alkaline phosphatase (APISO): 97 U/L (ref 35–144)
BUN/Creatinine Ratio: 33 (calc) — ABNORMAL HIGH (ref 6–22)
BUN: 39 mg/dL — ABNORMAL HIGH (ref 7–25)
CO2: 26 mmol/L (ref 20–32)
Calcium: 9.6 mg/dL (ref 8.6–10.3)
Chloride: 106 mmol/L (ref 98–110)
Creat: 1.19 mg/dL (ref 0.70–1.28)
Globulin: 3.4 g/dL (calc) (ref 1.9–3.7)
Glucose, Bld: 82 mg/dL (ref 65–99)
Potassium: 4.5 mmol/L (ref 3.5–5.3)
Sodium: 140 mmol/L (ref 135–146)
Total Bilirubin: 0.8 mg/dL (ref 0.2–1.2)
Total Protein: 7.7 g/dL (ref 6.1–8.1)
eGFR: 65 mL/min/{1.73_m2} (ref >=60)

## 2022-01-20 LAB — CBC WITH DIFFERENTIAL/PLATELET
Absolute Monocytes: 637 cells/uL (ref 200–950)
Basophils Absolute: 91 cells/uL (ref 0–200)
Basophils Relative: 1.4 %
Eosinophils Absolute: 163 cells/uL (ref 15–500)
Eosinophils Relative: 2.5 %
HCT: 39.7 % (ref 38.5–50.0)
Hemoglobin: 13.6 g/dL (ref 13.2–17.1)
Lymphs Abs: 1424 cells/uL (ref 850–3900)
MCH: 31.3 pg (ref 27.0–33.0)
MCHC: 34.3 g/dL (ref 32.0–36.0)
MCV: 91.3 fL (ref 80.0–100.0)
MPV: 10.7 fL (ref 7.5–12.5)
Monocytes Relative: 9.8 %
Neutro Abs: 4186 cells/uL (ref 1500–7800)
Neutrophils Relative %: 64.4 %
Platelets: 203 10*3/uL (ref 140–400)
RBC: 4.35 10*6/uL (ref 4.20–5.80)
RDW: 13.1 % (ref 11.0–15.0)
Total Lymphocyte: 21.9 %
WBC: 6.5 10*3/uL (ref 3.8–10.8)

## 2022-01-20 LAB — LIPID PANEL W/REFLEX DIRECT LDL
Cholesterol: 95 mg/dL (ref <200)
HDL: 33 mg/dL — ABNORMAL LOW (ref >=40)
LDL Cholesterol (Calc): 47 mg/dL (calc)
Non-HDL Cholesterol (Calc): 62 mg/dL (calc) (ref <130)
Total CHOL/HDL Ratio: 2.9 (calc) (ref <5.0)
Triglycerides: 66 mg/dL (ref <150)

## 2022-01-20 LAB — VITAMIN D 25 HYDROXY (VIT D DEFICIENCY, FRACTURES): Vit D, 25-Hydroxy: 35 ng/mL (ref 30–100)

## 2022-01-20 LAB — PSA: PSA: 0.94 ng/mL (ref <=4.00)

## 2022-03-09 DIAGNOSIS — Z952 Presence of prosthetic heart valve: Secondary | ICD-10-CM | POA: Diagnosis not present

## 2022-03-09 DIAGNOSIS — Z6841 Body Mass Index (BMI) 40.0 and over, adult: Secondary | ICD-10-CM | POA: Diagnosis not present

## 2022-03-09 DIAGNOSIS — B20 Human immunodeficiency virus [HIV] disease: Secondary | ICD-10-CM | POA: Diagnosis not present

## 2022-03-09 DIAGNOSIS — I2510 Atherosclerotic heart disease of native coronary artery without angina pectoris: Secondary | ICD-10-CM | POA: Diagnosis not present

## 2022-03-09 DIAGNOSIS — M1611 Unilateral primary osteoarthritis, right hip: Secondary | ICD-10-CM | POA: Diagnosis not present

## 2022-03-17 ENCOUNTER — Other Ambulatory Visit: Payer: Self-pay | Admitting: Family Medicine

## 2022-03-17 ENCOUNTER — Other Ambulatory Visit: Payer: Self-pay | Admitting: Medical-Surgical

## 2022-03-17 DIAGNOSIS — E1165 Type 2 diabetes mellitus with hyperglycemia: Secondary | ICD-10-CM

## 2022-04-06 DIAGNOSIS — M1611 Unilateral primary osteoarthritis, right hip: Secondary | ICD-10-CM | POA: Diagnosis not present

## 2022-04-06 DIAGNOSIS — Z952 Presence of prosthetic heart valve: Secondary | ICD-10-CM | POA: Diagnosis not present

## 2022-04-07 ENCOUNTER — Ambulatory Visit (INDEPENDENT_AMBULATORY_CARE_PROVIDER_SITE_OTHER): Payer: Medicare Other | Admitting: Family Medicine

## 2022-04-07 ENCOUNTER — Encounter: Payer: Self-pay | Admitting: Family Medicine

## 2022-04-07 VITALS — BP 90/62 | HR 72 | Ht 68.0 in | Wt 277.0 lb

## 2022-04-07 DIAGNOSIS — E1159 Type 2 diabetes mellitus with other circulatory complications: Secondary | ICD-10-CM

## 2022-04-07 DIAGNOSIS — E1165 Type 2 diabetes mellitus with hyperglycemia: Secondary | ICD-10-CM | POA: Diagnosis not present

## 2022-04-07 DIAGNOSIS — B20 Human immunodeficiency virus [HIV] disease: Secondary | ICD-10-CM

## 2022-04-07 DIAGNOSIS — I152 Hypertension secondary to endocrine disorders: Secondary | ICD-10-CM

## 2022-04-07 LAB — POCT GLYCOSYLATED HEMOGLOBIN (HGB A1C): HbA1c, POC (controlled diabetic range): 5.5 % (ref 0.0–7.0)

## 2022-04-07 NOTE — Assessment & Plan Note (Signed)
Blood sugars remain well controlled.  No hypoglycemic symptoms.  Continue Ozempic with tresiba.  Counseled on symptoms of hypoglycemia.

## 2022-04-07 NOTE — Patient Instructions (Addendum)
Reduce valsartan to 1/2 tab daily. Check BP at home and return in 1 week for BP recheck.

## 2022-04-07 NOTE — Assessment & Plan Note (Signed)
Continues to see ID.  Stable with Biktarvy.

## 2022-04-07 NOTE — Addendum Note (Signed)
Addended by: Peggye Ley on: 04/07/2022 10:26 AM   Modules accepted: Orders

## 2022-04-07 NOTE — Progress Notes (Signed)
Clifford Strong - 72 y.o. male MRN [id number]  Date of birth: [birth year]  Subjective Chief Complaint  Patient presents with   Diabetes   Hypertension    HPI Clifford Strong is a 72 y.o. male here today for follow up visit.   He reports that he is doing pretty well.  Continues to have hip pain and is hoping to have hip replacement soon. His diabetes control has been a barrier to this but is slowly improving with ozempic.  Weight is down a couple of pounds since last visit.  He does continue on tresiba as well.  Denies symptoms of hypoglycemia.   His BP is a little low today.  He felt a little fatigued walking back today.  Better after rest.  Denies chest pain.  He denies dizziness but did have some mild nausea.  Reports readings at home have been in the 956'O-130'Q systolic.  He is taking coreg and valsartan as prescribed.  He does continue to see cardiology for history of AV replacement.   He is seeing ID regularly for management of his Biktarvy.    ROS:  A comprehensive ROS was completed and negative except as noted per HPI  Allergies  Allergen Reactions   Lisinopril Cough    Past Medical History:  Diagnosis Date   Aortic stenosis    Degenerative joint disease (DJD) of hip 04/09/2017   Diabetes mellitus without complication (HCC)    Heart murmur    HIV infection ([place])    Hypertension    Obesity     Past Surgical History:  Procedure Laterality Date   AORTIC VALVE REPLACEMENT  2014    Social History   Socioeconomic History   Marital status: Married    Spouse name: Not on file   Number of children: Not on file   Years of education: Not on file   Highest education level: Not on file  Occupational History   Not on file  Tobacco Use   Smoking status: Never   Smokeless tobacco: Never  Vaping Use   Vaping Use: Never used  Substance and Sexual Activity   Alcohol use: No   Drug use: No   Sexual activity: Never    Partners: Female  Other Topics Concern   Not on file   Social History Narrative   Not on file   Social Determinants of Health   Financial Resource Strain: Not on file  Food Insecurity: Not on file  Transportation Needs: Not on file  Physical Activity: Not on file  Stress: Not on file  Social Connections: Not on file    Family History  Problem Relation Age of Onset   Diabetes Mother    Hypertension Mother    Heart attack Mother    Diabetes Father    Hypertension Father    Lung disease Father     Health Maintenance  Topic Date Due   Diabetic kidney evaluation - Urine ACR  Never done   OPHTHALMOLOGY EXAM  04/07/2022 (Originally 07/06/2018)   COLONOSCOPY (Pts 45-32yrs Insurance coverage will need to be confirmed)  06/18/2022 (Originally 01/15/1995)   Medicare Annual Wellness (AWV)  07/05/2022 (Originally 04/04/2018)   COVID-19 Vaccine (3 - Pfizer risk series) 04/24/2023 (Originally 02/16/2022)   HEMOGLOBIN A1C  07/01/2022   Diabetic kidney evaluation - eGFR measurement  01/20/2023   FOOT EXAM  04/08/2023   DTaP/Tdap/Td (3 - Td or Tdap) 12/06/2025   Pneumonia Vaccine 9+ Years old  Completed   INFLUENZA VACCINE  Completed  Hepatitis C Screening  Completed   Zoster Vaccines- Shingrix  Completed   HPV VACCINES  Aged Out     ----------------------------------------------------------------------------------------------------------------------------------------------------------------------------------------------------------------- Physical Exam BP 90/62 (BP Location: Left Arm, Patient Position: Sitting, Cuff Size: Large) Comment: Manual  Pulse 72   Ht 5\' 8"  (1.727 m)   Wt 277 lb (125.6 kg)   SpO2 99%   BMI 42.12 kg/m   Physical Exam Constitutional:      Appearance: Normal appearance.  Eyes:     General: No scleral icterus. Cardiovascular:     Rate and Rhythm: Normal rate and regular rhythm.  Pulmonary:     Effort: Pulmonary effort is normal.     Breath sounds: Normal breath sounds.  Musculoskeletal:     Cervical  back: Neck supple.  Neurological:     Mental Status: He is alert.  Psychiatric:        Mood and Affect: Mood normal.        Behavior: Behavior normal.     ------------------------------------------------------------------------------------------------------------------------------------------------------------------------------------------------------------------- Assessment and Plan  Hypertension associated with diabetes (HCC) BP is low today.  Reducing valsartan to 1/2 tab (160mg ) daily.  Continue coreg at current strength.  Increase fluids slightly.  See emergency care if developing chest pain or increasing dyspnea.    HIV disease ([place]) Continues to see ID.  Stable with Biktarvy.   Uncontrolled type 2 diabetes mellitus with hyperglycemia (HCC) Blood sugars remain well controlled.  No hypoglycemic symptoms.  Continue Ozempic with tresiba.  Counseled on symptoms of hypoglycemia.     No orders of the defined types were placed in this encounter.   No follow-ups on file.    This visit occurred during the SARS-CoV-2 public health emergency.  Safety protocols were in place, including screening questions prior to the visit, additional usage of staff PPE, and extensive cleaning of exam room while observing appropriate contact time as indicated for disinfecting solutions.

## 2022-04-07 NOTE — Assessment & Plan Note (Signed)
BP is low today.  Reducing valsartan to 1/2 tab (160mg ) daily.  Continue coreg at current strength.  Increase fluids slightly.  See emergency care if developing chest pain or increasing dyspnea.

## 2022-04-13 DIAGNOSIS — Z6841 Body Mass Index (BMI) 40.0 and over, adult: Secondary | ICD-10-CM | POA: Diagnosis not present

## 2022-04-13 DIAGNOSIS — B20 Human immunodeficiency virus [HIV] disease: Secondary | ICD-10-CM | POA: Diagnosis not present

## 2022-04-13 DIAGNOSIS — Z953 Presence of xenogenic heart valve: Secondary | ICD-10-CM | POA: Diagnosis not present

## 2022-04-13 DIAGNOSIS — I2510 Atherosclerotic heart disease of native coronary artery without angina pectoris: Secondary | ICD-10-CM | POA: Diagnosis not present

## 2022-04-13 DIAGNOSIS — M1611 Unilateral primary osteoarthritis, right hip: Secondary | ICD-10-CM | POA: Diagnosis not present

## 2022-04-13 DIAGNOSIS — E118 Type 2 diabetes mellitus with unspecified complications: Secondary | ICD-10-CM | POA: Diagnosis not present

## 2022-04-14 ENCOUNTER — Ambulatory Visit (INDEPENDENT_AMBULATORY_CARE_PROVIDER_SITE_OTHER): Payer: Medicare Other | Admitting: Family Medicine

## 2022-04-14 VITALS — BP 142/76 | HR 80 | Ht 68.0 in | Wt 279.0 lb

## 2022-04-14 DIAGNOSIS — Z0130 Encounter for examination of blood pressure without abnormal findings: Secondary | ICD-10-CM

## 2022-04-14 NOTE — Progress Notes (Signed)
Medical screening examination/treatment was performed by qualified clinical staff member and as supervising physician I was immediately available for consultation/collaboration. I have reviewed documentation and agree with assessment and plan.  Ashelynn Marks, DO  

## 2022-04-14 NOTE — Progress Notes (Signed)
   Subjective:    Patient ID: Clifford Strong, male    DOB: 1950-12-16, 72 y.o.   MRN: [id number]  HPI Pt here for bp check, pt denies any missed medication doses or problems with medication.    Review of Systems     Objective:   Physical Exam        Assessment & Plan:  Pts bp today was 142/76, pt advised to continue current medication plan without any changes and follow up as needed.

## 2022-04-18 DIAGNOSIS — I442 Atrioventricular block, complete: Secondary | ICD-10-CM | POA: Diagnosis not present

## 2022-04-20 ENCOUNTER — Other Ambulatory Visit: Payer: Self-pay | Admitting: Family Medicine

## 2022-04-20 DIAGNOSIS — E1165 Type 2 diabetes mellitus with hyperglycemia: Secondary | ICD-10-CM

## 2022-04-20 DIAGNOSIS — I350 Nonrheumatic aortic (valve) stenosis: Secondary | ICD-10-CM

## 2022-04-21 DIAGNOSIS — Z1330 Encounter for screening examination for mental health and behavioral disorders, unspecified: Secondary | ICD-10-CM | POA: Diagnosis not present

## 2022-04-21 DIAGNOSIS — Z950 Presence of cardiac pacemaker: Secondary | ICD-10-CM | POA: Diagnosis not present

## 2022-04-21 DIAGNOSIS — I10 Essential (primary) hypertension: Secondary | ICD-10-CM | POA: Diagnosis not present

## 2022-04-21 DIAGNOSIS — Z953 Presence of xenogenic heart valve: Secondary | ICD-10-CM | POA: Diagnosis not present

## 2022-05-03 DIAGNOSIS — I350 Nonrheumatic aortic (valve) stenosis: Secondary | ICD-10-CM | POA: Diagnosis not present

## 2022-05-03 DIAGNOSIS — E785 Hyperlipidemia, unspecified: Secondary | ICD-10-CM | POA: Diagnosis not present

## 2022-05-03 DIAGNOSIS — Z794 Long term (current) use of insulin: Secondary | ICD-10-CM | POA: Diagnosis not present

## 2022-05-03 DIAGNOSIS — I2510 Atherosclerotic heart disease of native coronary artery without angina pectoris: Secondary | ICD-10-CM | POA: Diagnosis not present

## 2022-05-03 DIAGNOSIS — B20 Human immunodeficiency virus [HIV] disease: Secondary | ICD-10-CM | POA: Diagnosis not present

## 2022-05-03 DIAGNOSIS — Z952 Presence of prosthetic heart valve: Secondary | ICD-10-CM | POA: Diagnosis not present

## 2022-05-03 DIAGNOSIS — Z01818 Encounter for other preprocedural examination: Secondary | ICD-10-CM | POA: Diagnosis not present

## 2022-05-03 DIAGNOSIS — I5030 Unspecified diastolic (congestive) heart failure: Secondary | ICD-10-CM | POA: Diagnosis not present

## 2022-05-03 DIAGNOSIS — Z6841 Body Mass Index (BMI) 40.0 and over, adult: Secondary | ICD-10-CM | POA: Diagnosis not present

## 2022-05-03 DIAGNOSIS — Z79899 Other long term (current) drug therapy: Secondary | ICD-10-CM | POA: Diagnosis not present

## 2022-05-03 DIAGNOSIS — M1611 Unilateral primary osteoarthritis, right hip: Secondary | ICD-10-CM | POA: Diagnosis not present

## 2022-05-03 DIAGNOSIS — E1165 Type 2 diabetes mellitus with hyperglycemia: Secondary | ICD-10-CM | POA: Diagnosis not present

## 2022-05-03 DIAGNOSIS — I10 Essential (primary) hypertension: Secondary | ICD-10-CM | POA: Diagnosis not present

## 2022-05-03 DIAGNOSIS — Z01810 Encounter for preprocedural cardiovascular examination: Secondary | ICD-10-CM | POA: Diagnosis not present

## 2022-05-03 DIAGNOSIS — I110 Hypertensive heart disease with heart failure: Secondary | ICD-10-CM | POA: Diagnosis not present

## 2022-05-03 DIAGNOSIS — Z01812 Encounter for preprocedural laboratory examination: Secondary | ICD-10-CM | POA: Diagnosis not present

## 2022-05-03 DIAGNOSIS — I459 Conduction disorder, unspecified: Secondary | ICD-10-CM | POA: Diagnosis not present

## 2022-05-03 DIAGNOSIS — E119 Type 2 diabetes mellitus without complications: Secondary | ICD-10-CM | POA: Diagnosis not present

## 2022-05-05 DIAGNOSIS — M1611 Unilateral primary osteoarthritis, right hip: Secondary | ICD-10-CM | POA: Diagnosis not present

## 2022-05-08 DIAGNOSIS — Z79899 Other long term (current) drug therapy: Secondary | ICD-10-CM | POA: Diagnosis not present

## 2022-05-08 DIAGNOSIS — I2510 Atherosclerotic heart disease of native coronary artery without angina pectoris: Secondary | ICD-10-CM | POA: Diagnosis not present

## 2022-05-08 DIAGNOSIS — I10 Essential (primary) hypertension: Secondary | ICD-10-CM | POA: Diagnosis not present

## 2022-05-08 DIAGNOSIS — Z96641 Presence of right artificial hip joint: Secondary | ICD-10-CM | POA: Diagnosis not present

## 2022-05-08 DIAGNOSIS — Z794 Long term (current) use of insulin: Secondary | ICD-10-CM | POA: Diagnosis not present

## 2022-05-08 DIAGNOSIS — M1611 Unilateral primary osteoarthritis, right hip: Secondary | ICD-10-CM | POA: Diagnosis not present

## 2022-05-08 DIAGNOSIS — E785 Hyperlipidemia, unspecified: Secondary | ICD-10-CM | POA: Diagnosis not present

## 2022-05-08 DIAGNOSIS — Z952 Presence of prosthetic heart valve: Secondary | ICD-10-CM | POA: Diagnosis not present

## 2022-05-08 DIAGNOSIS — Z888 Allergy status to other drugs, medicaments and biological substances status: Secondary | ICD-10-CM | POA: Diagnosis not present

## 2022-05-08 DIAGNOSIS — Z950 Presence of cardiac pacemaker: Secondary | ICD-10-CM | POA: Diagnosis not present

## 2022-05-08 DIAGNOSIS — Z471 Aftercare following joint replacement surgery: Secondary | ICD-10-CM | POA: Diagnosis not present

## 2022-05-08 DIAGNOSIS — Z6841 Body Mass Index (BMI) 40.0 and over, adult: Secondary | ICD-10-CM | POA: Diagnosis not present

## 2022-05-08 DIAGNOSIS — E119 Type 2 diabetes mellitus without complications: Secondary | ICD-10-CM | POA: Diagnosis not present

## 2022-05-08 DIAGNOSIS — I509 Heart failure, unspecified: Secondary | ICD-10-CM | POA: Diagnosis not present

## 2022-05-08 DIAGNOSIS — I110 Hypertensive heart disease with heart failure: Secondary | ICD-10-CM | POA: Diagnosis not present

## 2022-05-08 DIAGNOSIS — Z8616 Personal history of COVID-19: Secondary | ICD-10-CM | POA: Diagnosis not present

## 2022-05-08 DIAGNOSIS — Z21 Asymptomatic human immunodeficiency virus [HIV] infection status: Secondary | ICD-10-CM | POA: Diagnosis not present

## 2022-05-08 DIAGNOSIS — Z7982 Long term (current) use of aspirin: Secondary | ICD-10-CM | POA: Diagnosis not present

## 2022-05-08 DIAGNOSIS — I350 Nonrheumatic aortic (valve) stenosis: Secondary | ICD-10-CM | POA: Diagnosis not present

## 2022-05-09 DIAGNOSIS — Z96641 Presence of right artificial hip joint: Secondary | ICD-10-CM | POA: Insufficient documentation

## 2022-05-10 ENCOUNTER — Telehealth: Payer: Self-pay

## 2022-05-10 NOTE — Transitions of Care (Post Inpatient/ED Visit) (Unsigned)
   05/10/2022  Name: Clifford Strong MRN: [id number] DOB: 1950/05/07  Today's TOC FU Call Status: Today's TOC FU Call Status:: Unsuccessul Call (1st Attempt) Unsuccessful Call (1st Attempt) Date: 05/10/22  Attempted to reach the patient regarding the most recent Inpatient/ED visit.  Follow Up Plan: Additional outreach attempts will be made to reach the patient to complete the Transitions of Care (Post Inpatient/ED visit) call.   Signature Juanda Crumble, [place] Direct Dial [phone]

## 2022-05-11 NOTE — Transitions of Care (Post Inpatient/ED Visit) (Signed)
   05/11/2022  Name: Clifford Strong MRN: [id number] DOB: May 26, 1950  Today's TOC FU Call Status: Today's TOC FU Call Status:: Successful TOC FU Call Competed Unsuccessful Call (1st Attempt) Date: 05/10/22 [facility] FU Call Complete Date: 05/11/22  Transition Care Management Follow-up Telephone Call Date of Discharge: 05/09/22 Discharge Facility: Other ([facility]) Name of Other (Non-[facility]) Discharge Facility: [facility] Type of Discharge: Inpatient Admission Primary Inpatient Discharge Diagnosis:: right hip replacement How have you been since you were released from the hospital?: Better Any questions or concerns?: No  Items Reviewed: Did you receive and understand the discharge instructions provided?: Yes Medications obtained and verified?: Yes (Medications Reviewed) Any new allergies since your discharge?: No Dietary orders reviewed?: Yes Do you have support at home?: Yes People in Home: spouse  Home Care and Equipment/Supplies: [place] Ordered?: NA Has Agency set up a time to come to your home?: Yes [place] Visit Date: 05/12/22 Any new equipment or medical supplies ordered?: NA  Functional Questionnaire: Do you need assistance with bathing/showering or dressing?: Yes Do you need assistance with meal preparation?: No Do you need assistance with eating?: No Do you have difficulty maintaining continence: No Do you need assistance with getting out of bed/getting out of a chair/moving?: Yes Do you have difficulty managing or taking your medications?: No  Folllow up appointments reviewed: PCP Follow-up appointment confirmed?: [facility] Follow-up appointment confirmed?: Yes Date of Specialist follow-up appointment?: 05/25/22 Follow-Up Specialty Provider:: Dr Laurance Flatten Do you need transportation to your follow-up appointment?: No Do you understand care options if your condition(s) worsen?: Yes-patient verbalized  understanding    [place], [place] Nurse Health Advisor Direct Dial [phone]

## 2022-05-23 ENCOUNTER — Other Ambulatory Visit: Payer: Self-pay | Admitting: Family Medicine

## 2022-05-24 ENCOUNTER — Other Ambulatory Visit: Payer: Self-pay | Admitting: Family Medicine

## 2022-05-24 DIAGNOSIS — M1611 Unilateral primary osteoarthritis, right hip: Secondary | ICD-10-CM | POA: Diagnosis not present

## 2022-05-25 DIAGNOSIS — M25551 Pain in right hip: Secondary | ICD-10-CM | POA: Diagnosis not present

## 2022-05-29 DIAGNOSIS — M1611 Unilateral primary osteoarthritis, right hip: Secondary | ICD-10-CM | POA: Diagnosis not present

## 2022-06-01 DIAGNOSIS — M1611 Unilateral primary osteoarthritis, right hip: Secondary | ICD-10-CM | POA: Diagnosis not present

## 2022-06-04 DIAGNOSIS — Z7902 Long term (current) use of antithrombotics/antiplatelets: Secondary | ICD-10-CM | POA: Diagnosis not present

## 2022-06-04 DIAGNOSIS — Z794 Long term (current) use of insulin: Secondary | ICD-10-CM | POA: Diagnosis not present

## 2022-06-04 DIAGNOSIS — H1132 Conjunctival hemorrhage, left eye: Secondary | ICD-10-CM | POA: Diagnosis not present

## 2022-06-04 DIAGNOSIS — I509 Heart failure, unspecified: Secondary | ICD-10-CM | POA: Diagnosis not present

## 2022-06-04 DIAGNOSIS — E1122 Type 2 diabetes mellitus with diabetic chronic kidney disease: Secondary | ICD-10-CM | POA: Diagnosis not present

## 2022-06-04 DIAGNOSIS — I130 Hypertensive heart and chronic kidney disease with heart failure and stage 1 through stage 4 chronic kidney disease, or unspecified chronic kidney disease: Secondary | ICD-10-CM | POA: Diagnosis not present

## 2022-06-04 DIAGNOSIS — I129 Hypertensive chronic kidney disease with stage 1 through stage 4 chronic kidney disease, or unspecified chronic kidney disease: Secondary | ICD-10-CM | POA: Diagnosis not present

## 2022-06-04 DIAGNOSIS — Z888 Allergy status to other drugs, medicaments and biological substances status: Secondary | ICD-10-CM | POA: Diagnosis not present

## 2022-06-04 DIAGNOSIS — N1831 Chronic kidney disease, stage 3a: Secondary | ICD-10-CM | POA: Diagnosis not present

## 2022-06-05 ENCOUNTER — Telehealth: Payer: Self-pay | Admitting: General Practice

## 2022-06-05 DIAGNOSIS — M1611 Unilateral primary osteoarthritis, right hip: Secondary | ICD-10-CM | POA: Diagnosis not present

## 2022-06-05 NOTE — Transitions of Care (Post Inpatient/ED Visit) (Signed)
   06/05/2022  Name: Clifford Strong MRN: [id number] DOB: 17-Aug-1950  Today's TOC FU Call Status: Unsuccessful Call (1st Attempt) Date: 06/05/22  Attempted to reach the patient regarding the most recent Inpatient/ED visit.  Follow Up Plan: Additional outreach attempts will be made to reach the patient to complete the Transitions of Care (Post Inpatient/ED visit) call.   Signature Tinnie Gens, RN BSN

## 2022-06-06 NOTE — Transitions of Care (Post Inpatient/ED Visit) (Signed)
   06/06/2022  Name: Clifford Strong MRN: [id number] DOB: [birth year]  Today's TOC FU Call Status: Today's TOC FU Call Status:: Unsuccessful Call (2nd Attempt) Unsuccessful Call (1st Attempt) Date: 06/05/22 Unsuccessful Call (2nd Attempt) Date: 06/06/22  Attempted to reach the patient regarding the most recent Inpatient/ED visit.  Follow Up Plan: Additional outreach attempts will be made to reach the patient to complete the Transitions of Care (Post Inpatient/ED visit) call.   Signature Tinnie Gens, RN BSN

## 2022-06-07 NOTE — Transitions of Care (Post Inpatient/ED Visit) (Signed)
   06/07/2022  Name: Clifford Strong MRN: [id number] DOB: November 30, 1950  Today's TOC FU Call Status: Today's TOC FU Call Status:: Unsuccessful Call (3rd Attempt) Unsuccessful Call (1st Attempt) Date: 06/05/22 Unsuccessful Call (2nd Attempt) Date: 06/06/22 Unsuccessful Call (3rd Attempt) Date: 06/07/22  Attempted to reach the patient regarding the most recent Inpatient/ED visit.  Follow Up Plan: No further outreach attempts will be made at this time. We have been unable to contact the patient.  Signature Tinnie Gens, RN BSN

## 2022-06-08 ENCOUNTER — Encounter: Payer: Self-pay | Admitting: Family Medicine

## 2022-06-08 ENCOUNTER — Ambulatory Visit (INDEPENDENT_AMBULATORY_CARE_PROVIDER_SITE_OTHER): Payer: Medicare Other | Admitting: Family Medicine

## 2022-06-08 VITALS — BP 146/80 | HR 86 | Ht 68.0 in | Wt 274.0 lb

## 2022-06-08 DIAGNOSIS — R7989 Other specified abnormal findings of blood chemistry: Secondary | ICD-10-CM

## 2022-06-08 DIAGNOSIS — H1132 Conjunctival hemorrhage, left eye: Secondary | ICD-10-CM | POA: Diagnosis not present

## 2022-06-08 DIAGNOSIS — M1611 Unilateral primary osteoarthritis, right hip: Secondary | ICD-10-CM | POA: Diagnosis not present

## 2022-06-08 NOTE — Assessment & Plan Note (Signed)
Reduce lasix to 20mg  daily. Recheck renal function today.  He will work on increasing his hydration some.

## 2022-06-08 NOTE — Patient Instructions (Signed)
Reduce furosemide to 20mg  daily.  Increase fluid intake slightly.  Schedule diabetic eye exam.

## 2022-06-08 NOTE — Progress Notes (Signed)
Clifford Strong - 72 y.o. male MRN [id number]  Date of birth: 11/20/1950  Subjective Chief Complaint  Patient presents with   Hospitalization Follow-up    HPI Clifford Strong is a 72 y.o. male here today for follow up of recent ER visit.  Seen due to bruised/bloody spot in the L eye.  No changes to vision, PERRLA on exam in ED.  Dx with subconjunctival hemorrhage.  Reports that eye is looking better overall.  He denies pain or vision change.  Welling has improved.   Kidney function noted to be abnormal as well.  Serum creatinine elevated at 1.41 from baseline of about 1.1.  He did have hip surgery about 1 month ago.  He feels that he could do better with hydration  He has lasix for swelling, taking 40mg  daily.  He is not taking NSAIDs at this time.   ROS:  A comprehensive ROS was completed and negative except as noted per HPI   Allergies  Allergen Reactions   Lisinopril Cough    Past Medical History:  Diagnosis Date   Aortic stenosis    Degenerative joint disease (DJD) of hip 04/09/2017   Diabetes mellitus without complication    Heart murmur    HIV infection    Hypertension    Obesity     Past Surgical History:  Procedure Laterality Date   AORTIC VALVE REPLACEMENT  2014    Social History   Socioeconomic History   Marital status: Married    Spouse name: Not on file   Number of children: Not on file   Years of education: Not on file   Highest education level: 12th grade  Occupational History   Not on file  Tobacco Use   Smoking status: Never   Smokeless tobacco: Never  Vaping Use   Vaping Use: Never used  Substance and Sexual Activity   Alcohol use: No   Drug use: No   Sexual activity: Never    Partners: Female  Other Topics Concern   Not on file  Social History Narrative   Not on file   Social Determinants of Health   Financial Resource Strain: Low Risk  (06/07/2022)   Overall Financial Resource Strain (CARDIA)    Difficulty of Paying Living Expenses: Not hard  at all  Food Insecurity: No Food Insecurity (06/07/2022)   Hunger Vital Sign    Worried About Running Out of Food in the Last Year: Never true    Ran Out of Food in the Last Year: Never true  Transportation Needs: No Transportation Needs (06/07/2022)   PRAPARE - Hydrologist (Medical): No    Lack of Transportation (Non-Medical): No  Physical Activity: Sufficiently Active (06/07/2022)   Exercise Vital Sign    Days of Exercise per Week: 5 days    Minutes of Exercise per Session: 30 min  Stress: No Stress Concern Present (06/07/2022)   [place]    Feeling of Stress : Not at all  Social Connections: Unknown (06/07/2022)   Social Connection and Isolation Panel [NHANES]    Frequency of Communication with Friends and Family: Twice a week    Frequency of Social Gatherings with Friends and Family: Twice a week    Attends Religious Services: Patient declined    Marine scientist or Organizations: No    Attends Music therapist: Not on file    Marital Status: Married    Family History  Problem Relation Age of Onset   Diabetes Mother    Hypertension Mother    Heart attack Mother    Diabetes Father    Hypertension Father    Lung disease Father     Health Maintenance  Topic Date Due   Diabetic kidney evaluation - Urine ACR  Never done   OPHTHALMOLOGY EXAM  07/06/2018   COLONOSCOPY (Pts 45-53yrs Insurance coverage will need to be confirmed)  06/18/2022 (Originally 01/15/1995)   Medicare Annual Wellness (AWV)  07/05/2022 (Originally 04/04/2018)   COVID-19 Vaccine (3 - Pfizer risk series) 04/24/2023 (Originally 02/16/2022)   INFLUENZA VACCINE  10/05/2022   HEMOGLOBIN A1C  10/06/2022   Diabetic kidney evaluation - eGFR measurement  01/20/2023   FOOT EXAM  04/08/2023   DTaP/Tdap/Td (3 - Td or Tdap) 12/06/2025   Pneumonia Vaccine 70+ Years old  Completed   Hepatitis C Screening   Completed   Zoster Vaccines- Shingrix  Completed   HPV VACCINES  Aged Out     ----------------------------------------------------------------------------------------------------------------------------------------------------------------------------------------------------------------- Physical Exam BP (!) 146/80 (BP Location: Left Arm, Patient Position: Sitting, Cuff Size: Large)   Pulse 86   Ht 5\' 8"  (1.727 m)   Wt 274 lb (124.3 kg)   SpO2 100%   BMI 41.66 kg/m   Physical Exam Constitutional:      Appearance: Normal appearance.  HENT:     Head: Normocephalic and atraumatic.  Eyes:     Comments: Subconjunctival hemorrhage noted.  Bruising noted below the eye as well.   Neurological:     Mental Status: He is alert.     ------------------------------------------------------------------------------------------------------------------------------------------------------------------------------------------------------------------- Assessment and Plan  Elevated serum creatinine Reduce lasix to 20mg  daily. Recheck renal function today.  He will work on increasing his hydration some.    Subconjunctival hemorrhage of left eye Denies trauma.  Seems to be improving.  He will let me know if having new/worsening symptoms. Normal CBC/Platelets in ED.     No orders of the defined types were placed in this encounter.   No follow-ups on file.    This visit occurred during the SARS-CoV-2 public health emergency.  Safety protocols were in place, including screening questions prior to the visit, additional usage of staff PPE, and extensive cleaning of exam room while observing appropriate contact time as indicated for disinfecting solutions.

## 2022-06-08 NOTE — Assessment & Plan Note (Signed)
Denies trauma.  Seems to be improving.  He will let me know if having new/worsening symptoms. Normal CBC/Platelets in ED.

## 2022-06-09 LAB — BASIC METABOLIC PANEL
BUN: 21 mg/dL (ref 7–25)
CO2: 28 mmol/L (ref 20–32)
Calcium: 9.6 mg/dL (ref 8.6–10.3)
Chloride: 104 mmol/L (ref 98–110)
Creat: 1 mg/dL (ref 0.70–1.28)
Glucose, Bld: 113 mg/dL — ABNORMAL HIGH (ref 65–99)
Potassium: 4.5 mmol/L (ref 3.5–5.3)
Sodium: 141 mmol/L (ref 135–146)

## 2022-06-12 ENCOUNTER — Ambulatory Visit: Payer: BC Managed Care – PPO | Admitting: Family Medicine

## 2022-06-13 DIAGNOSIS — M1611 Unilateral primary osteoarthritis, right hip: Secondary | ICD-10-CM | POA: Diagnosis not present

## 2022-06-15 DIAGNOSIS — M1611 Unilateral primary osteoarthritis, right hip: Secondary | ICD-10-CM | POA: Diagnosis not present

## 2022-06-20 DIAGNOSIS — M1611 Unilateral primary osteoarthritis, right hip: Secondary | ICD-10-CM | POA: Diagnosis not present

## 2022-06-22 DIAGNOSIS — M1611 Unilateral primary osteoarthritis, right hip: Secondary | ICD-10-CM | POA: Diagnosis not present

## 2022-06-27 DIAGNOSIS — M1611 Unilateral primary osteoarthritis, right hip: Secondary | ICD-10-CM | POA: Diagnosis not present

## 2022-06-29 DIAGNOSIS — M1611 Unilateral primary osteoarthritis, right hip: Secondary | ICD-10-CM | POA: Diagnosis not present

## 2022-07-04 DIAGNOSIS — M1611 Unilateral primary osteoarthritis, right hip: Secondary | ICD-10-CM | POA: Diagnosis not present

## 2022-07-06 DIAGNOSIS — M1611 Unilateral primary osteoarthritis, right hip: Secondary | ICD-10-CM | POA: Diagnosis not present

## 2022-07-10 DIAGNOSIS — M1611 Unilateral primary osteoarthritis, right hip: Secondary | ICD-10-CM | POA: Diagnosis not present

## 2022-07-18 DIAGNOSIS — M1611 Unilateral primary osteoarthritis, right hip: Secondary | ICD-10-CM | POA: Diagnosis not present

## 2022-07-25 DIAGNOSIS — I442 Atrioventricular block, complete: Secondary | ICD-10-CM | POA: Diagnosis not present

## 2022-07-28 DIAGNOSIS — E1165 Type 2 diabetes mellitus with hyperglycemia: Secondary | ICD-10-CM | POA: Diagnosis not present

## 2022-07-28 DIAGNOSIS — Z96641 Presence of right artificial hip joint: Secondary | ICD-10-CM | POA: Diagnosis not present

## 2022-07-28 DIAGNOSIS — E785 Hyperlipidemia, unspecified: Secondary | ICD-10-CM | POA: Diagnosis not present

## 2022-07-28 DIAGNOSIS — B20 Human immunodeficiency virus [HIV] disease: Secondary | ICD-10-CM | POA: Diagnosis not present

## 2022-07-28 DIAGNOSIS — Z952 Presence of prosthetic heart valve: Secondary | ICD-10-CM | POA: Diagnosis not present

## 2022-07-28 DIAGNOSIS — Z794 Long term (current) use of insulin: Secondary | ICD-10-CM | POA: Diagnosis not present

## 2022-08-07 ENCOUNTER — Encounter: Payer: Self-pay | Admitting: Family Medicine

## 2022-08-07 ENCOUNTER — Ambulatory Visit (INDEPENDENT_AMBULATORY_CARE_PROVIDER_SITE_OTHER): Payer: Medicare Other | Admitting: Family Medicine

## 2022-08-07 VITALS — BP 130/79 | HR 77 | Ht 68.0 in | Wt 279.0 lb

## 2022-08-07 DIAGNOSIS — Z794 Long term (current) use of insulin: Secondary | ICD-10-CM

## 2022-08-07 DIAGNOSIS — I152 Hypertension secondary to endocrine disorders: Secondary | ICD-10-CM

## 2022-08-07 DIAGNOSIS — E1159 Type 2 diabetes mellitus with other circulatory complications: Secondary | ICD-10-CM | POA: Diagnosis not present

## 2022-08-07 DIAGNOSIS — H9193 Unspecified hearing loss, bilateral: Secondary | ICD-10-CM

## 2022-08-07 DIAGNOSIS — E1165 Type 2 diabetes mellitus with hyperglycemia: Secondary | ICD-10-CM

## 2022-08-07 DIAGNOSIS — B20 Human immunodeficiency virus [HIV] disease: Secondary | ICD-10-CM

## 2022-08-07 LAB — POCT GLYCOSYLATED HEMOGLOBIN (HGB A1C): HbA1c, POC (controlled diabetic range): 5.8 % (ref 0.0–7.0)

## 2022-08-07 LAB — POCT UA - MICROALBUMIN
Albumin/Creatinine Ratio, Urine, POC: <300
Creatinine, POC: 10 mg/dL
Microalbumin Ur, POC: 10 mg/L

## 2022-08-07 NOTE — Assessment & Plan Note (Signed)
Referral placed to audiology.  

## 2022-08-07 NOTE — Assessment & Plan Note (Addendum)
Diabetes is well controlled at this time.  Continue current medications. Referral placed for diabetic eye exam.  F/u in 6 months.

## 2022-08-07 NOTE — Assessment & Plan Note (Signed)
Followed by ID.  Stable at this time.

## 2022-08-07 NOTE — Progress Notes (Signed)
Clifford Strong - 72 y.o. male MRN [id number]  Date of birth: 11-18-1950  Subjective Chief Complaint  Patient presents with   Diabetes   Hearing Problem    HPI Clifford Strong is a 72 y.o. male here today for follow up.  He reports that he is feeling pretty well.    BP is elevated today.  Readings at home have looked pretty good.  Denies symptoms including new chest pain, dyspnea, dizziness or headache.   He is doing pretty well with combination of Ozempic and [place].  Tolerating well, denies episodes of hypoglycemia.  Weight is up some since last visit.    Continues to see ID for management of HIV.  Remains on Biktarvy.    He has diminished hearing.  Has worked around [employer] for most of his life.    ROS:  A comprehensive ROS was completed and negative except as noted per HPI    Allergies  Allergen Reactions   Lisinopril Cough    Past Medical History:  Diagnosis Date   Aortic stenosis    Degenerative joint disease (DJD) of hip 04/09/2017   Diabetes mellitus without complication (HCC)    Heart murmur    HIV infection (HCC)    Hypertension    Obesity     Past Surgical History:  Procedure Laterality Date   AORTIC VALVE REPLACEMENT  2014    Social History   Socioeconomic History   Marital status: Married    Spouse name: Not on file   Number of children: Not on file   Years of education: Not on file   Highest education level: 12th grade  Occupational History   Not on file  Tobacco Use   Smoking status: Never   Smokeless tobacco: Never  Vaping Use   Vaping Use: Never used  Substance and Sexual Activity   Alcohol use: No   Drug use: No   Sexual activity: Never    Partners: Female  Other Topics Concern   Not on file  Social History Narrative   Not on file   Social Determinants of Health   Financial Resource Strain: Low Risk  (06/07/2022)   Overall Financial Resource Strain (CARDIA)    Difficulty of Paying Living Expenses: Not hard at all  Food  Insecurity: No Food Insecurity (06/07/2022)   Hunger Vital Sign    Worried About Running Out of Food in the Last Year: Never true    Ran Out of Food in the Last Year: Never true  Transportation Needs: No Transportation Needs (06/07/2022)   PRAPARE - Administrator, Civil Service (Medical): No    Lack of Transportation (Non-Medical): No  Physical Activity: Sufficiently Active (06/07/2022)   Exercise Vital Sign    Days of Exercise per Week: 5 days    Minutes of Exercise per Session: 30 min  Stress: No Stress Concern Present (06/07/2022)   [employer] of Occupational Health - Occupational Stress Questionnaire    Feeling of Stress : Not at all  Social Connections: Unknown (06/07/2022)   Social Connection and Isolation Panel [NHANES]    Frequency of Communication with Friends and Family: Twice a week    Frequency of Social Gatherings with Friends and Family: Twice a week    Attends Religious Services: Patient declined    Database administrator or Organizations: No    Attends Engineer, structural: Not on file    Marital Status: Married    Family History  Problem Relation Age of  Onset   Diabetes Mother    Hypertension Mother    Heart attack Mother    Diabetes Father    Hypertension Father    Lung disease Father     Health Maintenance  Topic Date Due   OPHTHALMOLOGY EXAM  07/06/2018   Medicare Annual Wellness (AWV)  11/07/2022 (Originally 04/04/2018)   COVID-19 Vaccine (3 - Pfizer risk series) 04/24/2023 (Originally 02/16/2022)   INFLUENZA VACCINE  10/05/2022   HEMOGLOBIN A1C  02/06/2023   FOOT EXAM  04/08/2023   Diabetic kidney evaluation - eGFR measurement  06/08/2023   Diabetic kidney evaluation - Urine ACR  08/07/2023   DTaP/Tdap/Td (3 - Td or Tdap) 12/06/2025   Colonoscopy  05/01/2027   Pneumonia Vaccine 49+ Years old  Completed   Hepatitis C Screening  Completed   Zoster Vaccines- Shingrix  Completed   HPV VACCINES  Aged Out      ----------------------------------------------------------------------------------------------------------------------------------------------------------------------------------------------------------------- Physical Exam BP 130/79 (BP Location: Right Arm, Patient Position: Sitting, Cuff Size: Normal)   Pulse 77   Ht 5\' 8"  (1.727 m)   Wt 279 lb (126.6 kg)   SpO2 98%   BMI 42.42 kg/m   Physical Exam Constitutional:      Appearance: Normal appearance.  HENT:     Head: Normocephalic and atraumatic.     Right Ear: Tympanic membrane and ear canal normal.     Left Ear: Tympanic membrane and ear canal normal.  Eyes:     General: No scleral icterus. Cardiovascular:     Rate and Rhythm: Normal rate and regular rhythm.  Pulmonary:     Effort: Pulmonary effort is normal.     Breath sounds: Normal breath sounds.  Neurological:     Mental Status: He is alert.  Psychiatric:        Mood and Affect: Mood normal.        Behavior: Behavior normal.     ------------------------------------------------------------------------------------------------------------------------------------------------------------------------------------------------------------------- Assessment and Plan  Uncontrolled type 2 diabetes mellitus with hyperglycemia (HCC) Diabetes is well controlled at this time.  Continue current medications. Referral placed for diabetic eye exam.  F/u in 6 months.   Hypertension associated with diabetes (HCC) BP elevated initially, improved after recheck.  Readings better at home.  He'll continue current medications.   HIV disease (HCC) Followed by ID.  Stable at this time.   Bilateral hearing loss Referral placed to audiology.    No orders of the defined types were placed in this encounter.   Return in about 6 months (around 02/06/2023) for HTN/T2DM.    This visit occurred during the SARS-CoV-2 public health emergency.  Safety protocols were in place, including  screening questions prior to the visit, additional usage of staff PPE, and extensive cleaning of exam room while observing appropriate contact time as indicated for disinfecting solutions.

## 2022-08-07 NOTE — Assessment & Plan Note (Signed)
BP elevated initially, improved after recheck.  Readings better at home.  He'll continue current medications.

## 2022-08-09 DIAGNOSIS — M1611 Unilateral primary osteoarthritis, right hip: Secondary | ICD-10-CM | POA: Diagnosis not present

## 2022-08-30 ENCOUNTER — Other Ambulatory Visit: Payer: Self-pay | Admitting: Family Medicine

## 2022-09-23 DIAGNOSIS — Z8679 Personal history of other diseases of the circulatory system: Secondary | ICD-10-CM | POA: Diagnosis not present

## 2022-09-23 DIAGNOSIS — Z7982 Long term (current) use of aspirin: Secondary | ICD-10-CM | POA: Diagnosis not present

## 2022-09-23 DIAGNOSIS — Z794 Long term (current) use of insulin: Secondary | ICD-10-CM | POA: Diagnosis not present

## 2022-09-23 DIAGNOSIS — E785 Hyperlipidemia, unspecified: Secondary | ICD-10-CM | POA: Diagnosis not present

## 2022-09-23 DIAGNOSIS — M7989 Other specified soft tissue disorders: Secondary | ICD-10-CM | POA: Diagnosis not present

## 2022-09-23 DIAGNOSIS — I2510 Atherosclerotic heart disease of native coronary artery without angina pectoris: Secondary | ICD-10-CM | POA: Diagnosis not present

## 2022-09-23 DIAGNOSIS — E119 Type 2 diabetes mellitus without complications: Secondary | ICD-10-CM | POA: Diagnosis not present

## 2022-09-23 DIAGNOSIS — S93491A Sprain of other ligament of right ankle, initial encounter: Secondary | ICD-10-CM | POA: Diagnosis not present

## 2022-09-23 DIAGNOSIS — Z21 Asymptomatic human immunodeficiency virus [HIV] infection status: Secondary | ICD-10-CM | POA: Diagnosis not present

## 2022-09-23 DIAGNOSIS — Z888 Allergy status to other drugs, medicaments and biological substances status: Secondary | ICD-10-CM | POA: Diagnosis not present

## 2022-09-23 DIAGNOSIS — I10 Essential (primary) hypertension: Secondary | ICD-10-CM | POA: Diagnosis not present

## 2022-09-23 DIAGNOSIS — Z954 Presence of other heart-valve replacement: Secondary | ICD-10-CM | POA: Diagnosis not present

## 2022-09-23 DIAGNOSIS — Z79899 Other long term (current) drug therapy: Secondary | ICD-10-CM | POA: Diagnosis not present

## 2022-09-25 ENCOUNTER — Telehealth: Payer: Self-pay | Admitting: General Practice

## 2022-09-25 NOTE — Transitions of Care (Post Inpatient/ED Visit) (Signed)
   09/25/2022  Name: Clifford Strong MRN: [id number] DOB: 08-17-1950  Today's TOC FU Call Status: Today's TOC FU Call Status:: Unsuccessul Call (1st Attempt) Unsuccessful Call (1st Attempt) Date: 09/25/22  Attempted to reach the patient regarding the most recent Inpatient/ED visit.  Follow Up Plan: Additional outreach attempts will be made to reach the patient to complete the Transitions of Care (Post Inpatient/ED visit) call.   Signature [place] Charon, Control and instrumentation engineer

## 2022-09-27 NOTE — Transitions of Care (Post Inpatient/ED Visit) (Signed)
   09/27/2022  Name: Clifford Strong MRN: [id number] DOB: 01/21/1951  Today's TOC FU Call Status: Today's TOC FU Call Status:: Unsuccessful Call (2nd Attempt) Unsuccessful Call (1st Attempt) Date: 09/25/22 Unsuccessful Call (2nd Attempt) Date: 09/27/22  Attempted to reach the patient regarding the most recent Inpatient/ED visit.  Follow Up Plan: Additional outreach attempts will be made to reach the patient to complete the Transitions of Care (Post Inpatient/ED visit) call.   Signature [place] Charon, Control and instrumentation engineer

## 2022-09-28 NOTE — Transitions of Care (Post Inpatient/ED Visit) (Signed)
   09/28/2022  Name: Clifford Strong MRN: [id number] DOB: 06-Jul-1950  Today's TOC FU Call Status: Today's TOC FU Call Status:: Unsuccessful Call (3rd Attempt) Unsuccessful Call (1st Attempt) Date: 09/25/22 Unsuccessful Call (2nd Attempt) Date: 09/27/22 Unsuccessful Call (3rd Attempt) Date: 09/28/22  Attempted to reach the patient regarding the most recent Inpatient/ED visit.  Follow Up Plan: No further outreach attempts will be made at this time. We have been unable to contact the patient.  Signature [place] Charon, Control and instrumentation engineer

## 2022-10-20 DIAGNOSIS — I10 Essential (primary) hypertension: Secondary | ICD-10-CM | POA: Diagnosis not present

## 2022-10-20 DIAGNOSIS — Z953 Presence of xenogenic heart valve: Secondary | ICD-10-CM | POA: Diagnosis not present

## 2022-10-20 DIAGNOSIS — Z950 Presence of cardiac pacemaker: Secondary | ICD-10-CM | POA: Diagnosis not present

## 2022-10-24 DIAGNOSIS — Z950 Presence of cardiac pacemaker: Secondary | ICD-10-CM | POA: Diagnosis not present

## 2022-10-24 DIAGNOSIS — I442 Atrioventricular block, complete: Secondary | ICD-10-CM | POA: Diagnosis not present

## 2022-11-03 ENCOUNTER — Other Ambulatory Visit: Payer: Self-pay | Admitting: Family Medicine

## 2023-01-11 ENCOUNTER — Other Ambulatory Visit: Payer: Self-pay | Admitting: Family Medicine

## 2023-01-11 DIAGNOSIS — E1165 Type 2 diabetes mellitus with hyperglycemia: Secondary | ICD-10-CM

## 2023-01-17 ENCOUNTER — Encounter: Payer: Self-pay | Admitting: Family Medicine

## 2023-01-23 DIAGNOSIS — Z950 Presence of cardiac pacemaker: Secondary | ICD-10-CM | POA: Diagnosis not present

## 2023-01-23 DIAGNOSIS — I442 Atrioventricular block, complete: Secondary | ICD-10-CM | POA: Diagnosis not present

## 2023-02-07 ENCOUNTER — Other Ambulatory Visit: Payer: Self-pay | Admitting: Family Medicine

## 2023-02-07 DIAGNOSIS — E1165 Type 2 diabetes mellitus with hyperglycemia: Secondary | ICD-10-CM

## 2023-02-07 DIAGNOSIS — I350 Nonrheumatic aortic (valve) stenosis: Secondary | ICD-10-CM

## 2023-02-09 ENCOUNTER — Ambulatory Visit: Payer: Medicare Other | Admitting: Family Medicine

## 2023-02-09 DIAGNOSIS — Z794 Long term (current) use of insulin: Secondary | ICD-10-CM | POA: Diagnosis not present

## 2023-02-09 DIAGNOSIS — Z23 Encounter for immunization: Secondary | ICD-10-CM | POA: Diagnosis not present

## 2023-02-09 DIAGNOSIS — E785 Hyperlipidemia, unspecified: Secondary | ICD-10-CM | POA: Diagnosis not present

## 2023-02-09 DIAGNOSIS — E1165 Type 2 diabetes mellitus with hyperglycemia: Secondary | ICD-10-CM | POA: Diagnosis not present

## 2023-02-09 DIAGNOSIS — I10 Essential (primary) hypertension: Secondary | ICD-10-CM | POA: Diagnosis not present

## 2023-02-09 DIAGNOSIS — B20 Human immunodeficiency virus [HIV] disease: Secondary | ICD-10-CM | POA: Diagnosis not present

## 2023-02-16 ENCOUNTER — Ambulatory Visit (INDEPENDENT_AMBULATORY_CARE_PROVIDER_SITE_OTHER): Payer: Medicare Other | Admitting: Family Medicine

## 2023-02-16 ENCOUNTER — Encounter: Payer: Self-pay | Admitting: Family Medicine

## 2023-02-16 VITALS — BP 126/77 | HR 72 | Ht 68.0 in | Wt 298.0 lb

## 2023-02-16 DIAGNOSIS — E1159 Type 2 diabetes mellitus with other circulatory complications: Secondary | ICD-10-CM

## 2023-02-16 DIAGNOSIS — I152 Hypertension secondary to endocrine disorders: Secondary | ICD-10-CM

## 2023-02-16 DIAGNOSIS — E785 Hyperlipidemia, unspecified: Secondary | ICD-10-CM

## 2023-02-16 DIAGNOSIS — E1169 Type 2 diabetes mellitus with other specified complication: Secondary | ICD-10-CM

## 2023-02-16 DIAGNOSIS — B20 Human immunodeficiency virus [HIV] disease: Secondary | ICD-10-CM | POA: Diagnosis not present

## 2023-02-16 DIAGNOSIS — Z7985 Long-term (current) use of injectable non-insulin antidiabetic drugs: Secondary | ICD-10-CM

## 2023-02-16 DIAGNOSIS — E1165 Type 2 diabetes mellitus with hyperglycemia: Secondary | ICD-10-CM

## 2023-02-16 LAB — POCT GLYCOSYLATED HEMOGLOBIN (HGB A1C)
HbA1c, POC (controlled diabetic range): 6.9 % (ref 0.0–7.0)
HbA1c, POC (controlled diabetic range): 6.9 % (ref 0.0–7.0)

## 2023-02-16 MED ORDER — TOUJEO MAX SOLOSTAR 300 UNIT/ML ~~LOC~~ SOPN: 55.0000 [IU] | PEN_INJECTOR | Freq: Two times a day (BID) | SUBCUTANEOUS | 1 refills | Status: DC

## 2023-02-16 NOTE — Progress Notes (Unsigned)
Clifford Strong - 72 y.o. male MRN [id number]  Date of birth: May 29, 1950  Subjective No chief complaint on file.   HPI Clifford Strong is a 72 y.o. male here today for follow up.   He reports that he is feeling well.  Allergies  Allergen Reactions  . Lisinopril Cough    Past Medical History:  Diagnosis Date  . Aortic stenosis   . Degenerative joint disease (DJD) of hip 04/09/2017  . Diabetes mellitus without complication (HCC)   . Heart murmur   . HIV infection (HCC)   . Hypertension   . Obesity     Past Surgical History:  Procedure Laterality Date  . AORTIC VALVE REPLACEMENT  2014    Social History   Socioeconomic History  . Marital status: Married    Spouse name: Not on file  . Number of children: Not on file  . Years of education: Not on file  . Highest education level: 12th grade  Occupational History  . Not on file  Tobacco Use  . Smoking status: Never  . Smokeless tobacco: Never  Vaping Use  . Vaping status: Never Used  Substance and Sexual Activity  . Alcohol use: No  . Drug use: No  . Sexual activity: Never    Partners: Female  Other Topics Concern  . Not on file  Social History Narrative  . Not on file   Social Drivers of Health   Financial Resource Strain: Low Risk  (02/12/2023)   Overall Financial Resource Strain (CARDIA)   . Difficulty of Paying Living Expenses: Not hard at all  Food Insecurity: No Food Insecurity (02/12/2023)   Hunger Vital Sign   . Worried About Programme researcher, broadcasting/film/video in the Last Year: Never true   . Ran Out of Food in the Last Year: Never true  Transportation Needs: No Transportation Needs (02/12/2023)   PRAPARE - Transportation   . Lack of Transportation (Medical): No   . Lack of Transportation (Non-Medical): No  Physical Activity: Sufficiently Active (02/12/2023)   Exercise Vital Sign   . Days of Exercise per Week: 5 days   . Minutes of Exercise per Session: 30 min  Stress: No Stress Concern Present (02/12/2023)   [employer] of Occupational Health - Occupational Stress Questionnaire   . Feeling of Stress : Not at all  Social Connections: Unknown (02/12/2023)   Social Connection and Isolation Panel [NHANES]   . Frequency of Communication with Friends and Family: Three times a week   . Frequency of Social Gatherings with Friends and Family: Three times a week   . Attends Religious Services: Patient declined   . Active Member of Clubs or Organizations: No   . Attends Banker Meetings: Not on file   . Marital Status: Married    Family History  Problem Relation Age of Onset  . Diabetes Mother   . Hypertension Mother   . Heart attack Mother   . Diabetes Father   . Hypertension Father   . Lung disease Father     Health Maintenance  Topic Date Due  . Colonoscopy  Never done  . Medicare Annual Wellness (AWV)  04/04/2018  . OPHTHALMOLOGY EXAM  07/06/2018  . HEMOGLOBIN A1C  02/06/2023  . COVID-19 Vaccine (3 - Pfizer risk series) 04/24/2023 (Originally 02/16/2022)  . FOOT EXAM  04/08/2023  . Diabetic kidney evaluation - eGFR measurement  06/08/2023  . Diabetic kidney evaluation - Urine ACR  08/07/2023  . DTaP/Tdap/Td (3 - Td or  Tdap) 12/06/2025  . Pneumonia Vaccine 46+ Years old  Completed  . INFLUENZA VACCINE  Completed  . Hepatitis C Screening  Completed  . Zoster Vaccines- Shingrix  Completed  . HPV VACCINES  Aged Out     ----------------------------------------------------------------------------------------------------------------------------------------------------------------------------------------------------------------- Physical Exam There were no vitals taken for this visit.  Physical Exam  ------------------------------------------------------------------------------------------------------------------------------------------------------------------------------------------------------------------- Assessment and Plan  No problem-specific Assessment & Plan notes  found for this encounter.   No orders of the defined types were placed in this encounter.   No follow-ups on file.    This visit occurred during the SARS-CoV-2 public health emergency.  Safety protocols were in place, including screening questions prior to the visit, additional usage of staff PPE, and extensive cleaning of exam room while observing appropriate contact time as indicated for disinfecting solutions.

## 2023-02-16 NOTE — Patient Instructions (Signed)
CUT BACK ON SWEETS!    Change insulin to Toujeo Max 55 units in the morning and 55 units in the evening.   See me again in 3 months.

## 2023-02-18 NOTE — Assessment & Plan Note (Signed)
Tolerating atorvastatin well, continue current strength.

## 2023-02-18 NOTE — Assessment & Plan Note (Signed)
Followed by ID.  Stable at this time.

## 2023-02-18 NOTE — Assessment & Plan Note (Signed)
Diabetes control has worsened.  Change tresiba to Toujeo max due to formulary change.  Split insulin to 55 units BID.  Continue Ozempic 2mg .  Dietary habit change encouraged.

## 2023-02-18 NOTE — Assessment & Plan Note (Signed)
BP elevated initially, improved after recheck.  Readings better at home.  He'll continue current medications.

## 2023-03-02 ENCOUNTER — Other Ambulatory Visit: Payer: Self-pay

## 2023-03-02 MED ORDER — TOUJEO MAX SOLOSTAR 300 UNIT/ML ~~LOC~~ SOPN: 54.0000 [IU] | PEN_INJECTOR | Freq: Two times a day (BID) | SUBCUTANEOUS | 1 refills | Status: DC

## 2023-03-19 DIAGNOSIS — N401 Enlarged prostate with lower urinary tract symptoms: Secondary | ICD-10-CM | POA: Diagnosis not present

## 2023-03-19 DIAGNOSIS — Z7982 Long term (current) use of aspirin: Secondary | ICD-10-CM | POA: Diagnosis not present

## 2023-03-19 DIAGNOSIS — Z6833 Body mass index (BMI) 33.0-33.9, adult: Secondary | ICD-10-CM | POA: Diagnosis not present

## 2023-03-19 DIAGNOSIS — R9389 Abnormal findings on diagnostic imaging of other specified body structures: Secondary | ICD-10-CM | POA: Diagnosis not present

## 2023-03-19 DIAGNOSIS — E785 Hyperlipidemia, unspecified: Secondary | ICD-10-CM | POA: Diagnosis not present

## 2023-03-19 DIAGNOSIS — N281 Cyst of kidney, acquired: Secondary | ICD-10-CM | POA: Diagnosis not present

## 2023-03-19 DIAGNOSIS — N2889 Other specified disorders of kidney and ureter: Secondary | ICD-10-CM | POA: Diagnosis not present

## 2023-03-19 DIAGNOSIS — D7389 Other diseases of spleen: Secondary | ICD-10-CM | POA: Diagnosis not present

## 2023-03-19 DIAGNOSIS — N400 Enlarged prostate without lower urinary tract symptoms: Secondary | ICD-10-CM | POA: Diagnosis not present

## 2023-03-19 DIAGNOSIS — B20 Human immunodeficiency virus [HIV] disease: Secondary | ICD-10-CM | POA: Diagnosis not present

## 2023-03-19 DIAGNOSIS — J8410 Pulmonary fibrosis, unspecified: Secondary | ICD-10-CM | POA: Diagnosis not present

## 2023-03-19 DIAGNOSIS — N3001 Acute cystitis with hematuria: Secondary | ICD-10-CM | POA: Diagnosis not present

## 2023-03-19 DIAGNOSIS — E8770 Fluid overload, unspecified: Secondary | ICD-10-CM | POA: Diagnosis not present

## 2023-03-19 DIAGNOSIS — E669 Obesity, unspecified: Secondary | ICD-10-CM | POA: Diagnosis not present

## 2023-03-19 DIAGNOSIS — R338 Other retention of urine: Secondary | ICD-10-CM | POA: Diagnosis not present

## 2023-03-19 DIAGNOSIS — R161 Splenomegaly, not elsewhere classified: Secondary | ICD-10-CM | POA: Diagnosis not present

## 2023-03-19 DIAGNOSIS — R9431 Abnormal electrocardiogram [ECG] [EKG]: Secondary | ICD-10-CM | POA: Diagnosis not present

## 2023-03-19 DIAGNOSIS — I110 Hypertensive heart disease with heart failure: Secondary | ICD-10-CM | POA: Diagnosis not present

## 2023-03-19 DIAGNOSIS — R531 Weakness: Secondary | ICD-10-CM | POA: Diagnosis not present

## 2023-03-19 DIAGNOSIS — R0989 Other specified symptoms and signs involving the circulatory and respiratory systems: Secondary | ICD-10-CM | POA: Diagnosis not present

## 2023-03-19 DIAGNOSIS — N323 Diverticulum of bladder: Secondary | ICD-10-CM | POA: Diagnosis not present

## 2023-03-19 DIAGNOSIS — Z794 Long term (current) use of insulin: Secondary | ICD-10-CM | POA: Diagnosis not present

## 2023-03-19 DIAGNOSIS — Z7985 Long-term (current) use of injectable non-insulin antidiabetic drugs: Secondary | ICD-10-CM | POA: Diagnosis not present

## 2023-03-19 DIAGNOSIS — Z950 Presence of cardiac pacemaker: Secondary | ICD-10-CM | POA: Diagnosis not present

## 2023-03-19 DIAGNOSIS — I509 Heart failure, unspecified: Secondary | ICD-10-CM | POA: Diagnosis not present

## 2023-03-19 DIAGNOSIS — E119 Type 2 diabetes mellitus without complications: Secondary | ICD-10-CM | POA: Diagnosis not present

## 2023-03-19 DIAGNOSIS — I4430 Unspecified atrioventricular block: Secondary | ICD-10-CM | POA: Diagnosis not present

## 2023-03-19 DIAGNOSIS — I517 Cardiomegaly: Secondary | ICD-10-CM | POA: Diagnosis not present

## 2023-03-19 DIAGNOSIS — I2510 Atherosclerotic heart disease of native coronary artery without angina pectoris: Secondary | ICD-10-CM | POA: Diagnosis not present

## 2023-04-06 DIAGNOSIS — Z953 Presence of xenogenic heart valve: Secondary | ICD-10-CM | POA: Diagnosis not present

## 2023-04-06 DIAGNOSIS — R0600 Dyspnea, unspecified: Secondary | ICD-10-CM | POA: Diagnosis not present

## 2023-04-06 DIAGNOSIS — I10 Essential (primary) hypertension: Secondary | ICD-10-CM | POA: Diagnosis not present

## 2023-04-06 DIAGNOSIS — Z950 Presence of cardiac pacemaker: Secondary | ICD-10-CM | POA: Diagnosis not present

## 2023-04-09 ENCOUNTER — Encounter: Payer: Self-pay | Admitting: Family Medicine

## 2023-04-11 ENCOUNTER — Encounter: Payer: Self-pay | Admitting: Family Medicine

## 2023-04-11 ENCOUNTER — Ambulatory Visit (INDEPENDENT_AMBULATORY_CARE_PROVIDER_SITE_OTHER): Payer: Medicare Other | Admitting: Family Medicine

## 2023-04-11 VITALS — BP 156/79 | HR 79 | Ht 68.0 in | Wt 294.0 lb

## 2023-04-11 DIAGNOSIS — N3000 Acute cystitis without hematuria: Secondary | ICD-10-CM | POA: Diagnosis not present

## 2023-04-11 DIAGNOSIS — N400 Enlarged prostate without lower urinary tract symptoms: Secondary | ICD-10-CM | POA: Diagnosis not present

## 2023-04-11 DIAGNOSIS — N2882 Megaloureter: Secondary | ICD-10-CM | POA: Diagnosis not present

## 2023-04-11 DIAGNOSIS — N390 Urinary tract infection, site not specified: Secondary | ICD-10-CM | POA: Insufficient documentation

## 2023-04-11 DIAGNOSIS — R300 Dysuria: Secondary | ICD-10-CM | POA: Diagnosis not present

## 2023-04-11 LAB — POCT URINALYSIS DIP (CLINITEK)
Bilirubin, UA: NEGATIVE
Glucose, UA: NEGATIVE mg/dL
Ketones, POC UA: NEGATIVE mg/dL
Nitrite, UA: POSITIVE — AB
POC PROTEIN,UA: 100 — AB
Spec Grav, UA: 1.02 (ref 1.010–1.025)
Urobilinogen, UA: 0.2 U/dL
pH, UA: 6.5 (ref 5.0–8.0)

## 2023-04-11 MED ORDER — CEFDINIR 300 MG PO CAPS: 300.0000 mg | ORAL_CAPSULE | Freq: Two times a day (BID) | ORAL | 0 refills | Status: DC

## 2023-04-11 MED ORDER — CEFTRIAXONE SODIUM 1 G IJ SOLR
1.0000 g | Freq: Once | INTRAMUSCULAR | Status: AC
  Administered 2023-04-11: 1 g via INTRAMUSCULAR

## 2023-04-11 NOTE — Assessment & Plan Note (Signed)
 UA consistent with continued UTI.  No significant improvement with keflex.  Given 1 gram of rocephin  and will continue an addition 10 days of omnicef .  Urology referral placed.  Checking PSA today as well given prostate enlargement noted on CT.

## 2023-04-11 NOTE — Progress Notes (Signed)
 Clifford Strong - 73 y.o. male MRN [id number]  Date of birth: [birth year]  Subjective Chief Complaint  Patient presents with   Urinary Tract Infection    HPI Clifford Strong is a 73 y.o. male here today for follow up of recent ED visit.   He was seen in the ED with concern for dyspnea.  Noted to have mild vascular congestion on Xray.  Also to have UTI.   CT also noted prostatic enlargement and enlargement of left ureter.  Urology referral placed but he has not heard anything regarding this.  He was treated with cephalexin.  Culture did show >100,000 cfu of E. Coli that was sensitive for cefazolin.  He has not noted any blood in his urine, significant pain, flank pain or fevers.  His main symptoms remains urinary frequency.   ROS:  A comprehensive ROS was completed and negative except as noted per HPI  Allergies  Allergen Reactions   Lisinopril Cough    Past Medical History:  Diagnosis Date   Aortic stenosis    Degenerative joint disease (DJD) of hip 04/09/2017   Diabetes mellitus without complication (HCC)    Heart murmur    HIV infection (HCC)    Hypertension    Obesity     Past Surgical History:  Procedure Laterality Date   AORTIC VALVE REPLACEMENT  2014    Social History   Socioeconomic History   Marital status: Married    Spouse name: Not on file   Number of children: Not on file   Years of education: Not on file   Highest education level: 12th grade  Occupational History   Not on file  Tobacco Use   Smoking status: Never   Smokeless tobacco: Never  Vaping Use   Vaping status: Never Used  Substance and Sexual Activity   Alcohol use: No   Drug use: No   Sexual activity: Never    Partners: Female  Other Topics Concern   Not on file  Social History Narrative   Not on file   Social Drivers of Health   Financial Resource Strain: Low Risk  (02/12/2023)   Overall Financial Resource Strain (CARDIA)    Difficulty of Paying Living Expenses: Not hard at all  Food  Insecurity: No Food Insecurity (02/12/2023)   Hunger Vital Sign    Worried About Running Out of Food in the Last Year: Never true    Ran Out of Food in the Last Year: Never true  Transportation Needs: No Transportation Needs (02/12/2023)   PRAPARE - Administrator, Civil Service (Medical): No    Lack of Transportation (Non-Medical): No  Physical Activity: Sufficiently Active (02/12/2023)   Exercise Vital Sign    Days of Exercise per Week: 5 days    Minutes of Exercise per Session: 30 min  Stress: No Stress Concern Present (02/12/2023)   [employer] of Occupational Health - Occupational Stress Questionnaire    Feeling of Stress : Not at all  Social Connections: Unknown (02/12/2023)   Social Connection and Isolation Panel [NHANES]    Frequency of Communication with Friends and Family: Three times a week    Frequency of Social Gatherings with Friends and Family: Three times a week    Attends Religious Services: Patient declined    Active Member of Clubs or Organizations: No    Attends Engineer, Structural: Not on file    Marital Status: Married    Family History  Problem Relation Age of Onset  Diabetes Mother    Hypertension Mother    Heart attack Mother    Diabetes Father    Hypertension Father    Lung disease Father     Health Maintenance  Topic Date Due   Colonoscopy  Never done   Medicare Annual Wellness (AWV)  04/04/2018   OPHTHALMOLOGY EXAM  07/06/2018   FOOT EXAM  04/08/2023   COVID-19 Vaccine (3 - Pfizer risk series) 04/24/2023 (Originally 02/16/2022)   Diabetic kidney evaluation - eGFR measurement  06/08/2023   Diabetic kidney evaluation - Urine ACR  08/07/2023   HEMOGLOBIN A1C  08/17/2023   DTaP/Tdap/Td (3 - Td or Tdap) 12/06/2025   Pneumonia Vaccine 39+ Years old  Completed   INFLUENZA VACCINE  Completed   Hepatitis C Screening  Completed   Zoster Vaccines- Shingrix  Completed   HPV VACCINES  Aged Out      ----------------------------------------------------------------------------------------------------------------------------------------------------------------------------------------------------------------- Physical Exam BP (!) 156/79 (BP Location: Left Arm, Patient Position: Sitting, Cuff Size: Large)   Pulse 79   Ht 5' 8 (1.727 m)   Wt 294 lb (133.4 kg)   SpO2 98%   BMI 44.70 kg/m   Physical Exam Constitutional:      Appearance: Normal appearance.  HENT:     Head: Normocephalic and atraumatic.  Eyes:     General: No scleral icterus. Cardiovascular:     Rate and Rhythm: Normal rate and regular rhythm.  Pulmonary:     Effort: Pulmonary effort is normal.     Breath sounds: Normal breath sounds.  Neurological:     Mental Status: He is alert.  Psychiatric:        Mood and Affect: Mood normal.        Behavior: Behavior normal.     ------------------------------------------------------------------------------------------------------------------------------------------------------------------------------------------------------------------- Assessment and Plan  UTI (urinary tract infection) UA consistent with continued UTI.  No significant improvement with keflex.  Given 1 gram of rocephin  and will continue an addition 10 days of omnicef .  Urology referral placed.  Checking PSA today as well given prostate enlargement noted on CT.     Meds ordered this encounter  Medications   cefdinir  (OMNICEF ) 300 MG capsule    Sig: Take 1 capsule (300 mg total) by mouth 2 (two) times daily for 10 days.    Dispense:  20 capsule    Refill:  0    No follow-ups on file.    This visit occurred during the SARS-CoV-2 public health emergency.  Safety protocols were in place, including screening questions prior to the visit, additional usage of staff PPE, and extensive cleaning of exam room while observing appropriate contact time as indicated for disinfecting solutions.

## 2023-04-11 NOTE — Patient Instructions (Signed)
 Start cefdinir  tomorrow.  Stay well hydrated.  We'll be in touch with culture results.

## 2023-04-12 ENCOUNTER — Other Ambulatory Visit: Payer: Self-pay

## 2023-04-12 DIAGNOSIS — Z953 Presence of xenogenic heart valve: Secondary | ICD-10-CM | POA: Diagnosis not present

## 2023-04-12 DIAGNOSIS — R0600 Dyspnea, unspecified: Secondary | ICD-10-CM | POA: Diagnosis not present

## 2023-04-12 DIAGNOSIS — I10 Essential (primary) hypertension: Secondary | ICD-10-CM | POA: Diagnosis not present

## 2023-04-12 DIAGNOSIS — Z950 Presence of cardiac pacemaker: Secondary | ICD-10-CM | POA: Diagnosis not present

## 2023-04-12 LAB — BASIC METABOLIC PANEL
BUN/Creatinine Ratio: 19 (ref 10–24)
BUN: 21 mg/dL (ref 8–27)
CO2: 22 mmol/L (ref 20–29)
Calcium: 9.4 mg/dL (ref 8.6–10.2)
Chloride: 101 mmol/L (ref 96–106)
Creatinine, Ser: 1.12 mg/dL (ref 0.76–1.27)
Glucose: 84 mg/dL (ref 70–99)
Potassium: 5 mmol/L (ref 3.5–5.2)
Sodium: 136 mmol/L (ref 134–144)
eGFR: 69 mL/min/{1.73_m2} (ref >59)

## 2023-04-12 LAB — MAGNESIUM: Magnesium: 2 mg/dL (ref 1.6–2.3)

## 2023-04-12 LAB — PSA: Prostate Specific Ag, Serum: 1 ng/mL (ref 0.0–4.0)

## 2023-04-12 MED ORDER — CEFDINIR 300 MG PO CAPS: 300.0000 mg | ORAL_CAPSULE | Freq: Two times a day (BID) | ORAL | 0 refills | Status: DC

## 2023-04-12 NOTE — Telephone Encounter (Signed)
 Copied from CRM [id number]. Topic: Clinical - Prescription Issue >> Apr 12, 2023  9:04 AM Clifford Strong wrote: Reason for CRM: Patient states new prescription Cefdinir  needs to go to [facility] in [place] on [address] for him to be able to get it.

## 2023-04-16 LAB — URINE CULTURE

## 2023-04-19 DIAGNOSIS — Z953 Presence of xenogenic heart valve: Secondary | ICD-10-CM | POA: Diagnosis not present

## 2023-04-19 DIAGNOSIS — I10 Essential (primary) hypertension: Secondary | ICD-10-CM | POA: Diagnosis not present

## 2023-04-19 DIAGNOSIS — Z950 Presence of cardiac pacemaker: Secondary | ICD-10-CM | POA: Diagnosis not present

## 2023-04-20 ENCOUNTER — Ambulatory Visit: Payer: Medicare Other

## 2023-04-20 ENCOUNTER — Ambulatory Visit (INDEPENDENT_AMBULATORY_CARE_PROVIDER_SITE_OTHER): Payer: Medicare Other | Admitting: Family Medicine

## 2023-04-20 ENCOUNTER — Encounter: Payer: Self-pay | Admitting: Family Medicine

## 2023-04-20 VITALS — BP 134/75 | HR 76 | Ht 68.0 in | Wt 295.0 lb

## 2023-04-20 DIAGNOSIS — Z794 Long term (current) use of insulin: Secondary | ICD-10-CM | POA: Diagnosis not present

## 2023-04-20 DIAGNOSIS — N3000 Acute cystitis without hematuria: Secondary | ICD-10-CM

## 2023-04-20 DIAGNOSIS — I152 Hypertension secondary to endocrine disorders: Secondary | ICD-10-CM

## 2023-04-20 DIAGNOSIS — E1169 Type 2 diabetes mellitus with other specified complication: Secondary | ICD-10-CM | POA: Diagnosis not present

## 2023-04-20 DIAGNOSIS — R0989 Other specified symptoms and signs involving the circulatory and respiratory systems: Secondary | ICD-10-CM | POA: Diagnosis not present

## 2023-04-20 DIAGNOSIS — R0609 Other forms of dyspnea: Secondary | ICD-10-CM | POA: Diagnosis not present

## 2023-04-20 DIAGNOSIS — R0600 Dyspnea, unspecified: Secondary | ICD-10-CM

## 2023-04-20 DIAGNOSIS — Z9889 Other specified postprocedural states: Secondary | ICD-10-CM | POA: Diagnosis not present

## 2023-04-20 DIAGNOSIS — E1159 Type 2 diabetes mellitus with other circulatory complications: Secondary | ICD-10-CM

## 2023-04-20 DIAGNOSIS — E1165 Type 2 diabetes mellitus with hyperglycemia: Secondary | ICD-10-CM | POA: Diagnosis not present

## 2023-04-20 DIAGNOSIS — E785 Hyperlipidemia, unspecified: Secondary | ICD-10-CM

## 2023-04-20 DIAGNOSIS — Z950 Presence of cardiac pacemaker: Secondary | ICD-10-CM | POA: Diagnosis not present

## 2023-04-20 MED ORDER — VALSARTAN 160 MG PO TABS: 160.0000 mg | ORAL_TABLET | Freq: Every day | ORAL | 1 refills | Status: DC

## 2023-04-20 NOTE — Assessment & Plan Note (Signed)
Cardiology evaluating.  Will get CXR today as well.

## 2023-04-20 NOTE — Assessment & Plan Note (Signed)
BP is well controlled. He'll continue current medications.

## 2023-04-20 NOTE — Assessment & Plan Note (Signed)
Symptoms pretty much resolved at this point.  He will complete antibiotic.

## 2023-04-20 NOTE — Assessment & Plan Note (Signed)
He has had some exertional dyspnea.  Echo pending from cardiology.

## 2023-04-20 NOTE — Assessment & Plan Note (Signed)
Tolerating atorvastatin well, continue current strength.

## 2023-04-20 NOTE — Assessment & Plan Note (Signed)
Doing well at this time. Last A1c 6.9%.  Continue current medications and encouraged dietary changes.

## 2023-04-20 NOTE — Progress Notes (Signed)
Clifford Strong - 73 y.o. male MRN [id number]  Date of birth: 01-May-1950  Subjective Chief Complaint  Patient presents with   Medical Management of Chronic Issues    HPI Clifford Strong is a 73 y.o. male here today for follow up.   He was recently seen in clinic for UTI.  Treated with ceftriaxone x1 and Omnicef after he didn't have improvement with keflex prescribed in ED.  Reports that symptoms are much better. Marland Kitchen   Continues on Ozempic and toujeo for management of diabetes. Doing well with this.  A1c in December was 6.9%.  Denies hypoglycemia.   BP is well controlled. .  No side effects with valsartan or coreg.  Denies chest pain,  palpitations, headache or vision changes.  Has some exertional dyspnea.  Feels like hs has some chest congestion when laying down at night.  Seeing cardiology and had echo yesterday.  Results pending.   ROS:  A comprehensive ROS was completed and negative except as noted per HPI  Allergies  Allergen Reactions   Lisinopril Cough    Past Medical History:  Diagnosis Date   Aortic stenosis    Degenerative joint disease (DJD) of hip 04/09/2017   Diabetes mellitus without complication (HCC)    Heart murmur    HIV infection (HCC)    Hypertension    Obesity     Past Surgical History:  Procedure Laterality Date   AORTIC VALVE REPLACEMENT  2014    Social History   Socioeconomic History   Marital status: Married    Spouse name: Not on file   Number of children: Not on file   Years of education: Not on file   Highest education level: 12th grade  Occupational History   Not on file  Tobacco Use   Smoking status: Never   Smokeless tobacco: Never  Vaping Use   Vaping status: Never Used  Substance and Sexual Activity   Alcohol use: No   Drug use: No   Sexual activity: Never    Partners: Female  Other Topics Concern   Not on file  Social History Narrative   Not on file   Social Drivers of Health   Financial Resource Strain: Low Risk  (04/16/2023)    Overall Financial Resource Strain (CARDIA)    Difficulty of Paying Living Expenses: Not hard at all  Food Insecurity: No Food Insecurity (04/16/2023)   Hunger Vital Sign    Worried About Running Out of Food in the Last Year: Never true    Ran Out of Food in the Last Year: Never true  Transportation Needs: No Transportation Needs (04/16/2023)   PRAPARE - Administrator, Civil Service (Medical): No    Lack of Transportation (Non-Medical): No  Physical Activity: Sufficiently Active (04/16/2023)   Exercise Vital Sign    Days of Exercise per Week: 5 days    Minutes of Exercise per Session: 30 min  Stress: No Stress Concern Present (04/16/2023)   [employer] of Occupational Health - Occupational Stress Questionnaire    Feeling of Stress : Not at all  Social Connections: Unknown (04/16/2023)   Social Connection and Isolation Panel [NHANES]    Frequency of Communication with Friends and Family: Three times a week    Frequency of Social Gatherings with Friends and Family: More than three times a week    Attends Religious Services: Patient declined    Active Member of Clubs or Organizations: No    Attends Banker Meetings: Not on  file    Marital Status: Married    Family History  Problem Relation Age of Onset   Diabetes Mother    Hypertension Mother    Heart attack Mother    Diabetes Father    Hypertension Father    Lung disease Father     Health Maintenance  Topic Date Due   Colonoscopy  Never done   Medicare Annual Wellness (AWV)  04/04/2018   OPHTHALMOLOGY EXAM  07/06/2018   FOOT EXAM  04/08/2023   COVID-19 Vaccine (3 - Pfizer risk series) 04/24/2023 (Originally 02/16/2022)   Diabetic kidney evaluation - Urine ACR  08/07/2023   HEMOGLOBIN A1C  08/17/2023   Diabetic kidney evaluation - eGFR measurement  04/10/2024   DTaP/Tdap/Td (3 - Td or Tdap) 12/06/2025   Pneumonia Vaccine 78+ Years old  Completed   INFLUENZA VACCINE  Completed   Hepatitis C  Screening  Completed   Zoster Vaccines- Shingrix  Completed   HPV VACCINES  Aged Out     ----------------------------------------------------------------------------------------------------------------------------------------------------------------------------------------------------------------- Physical Exam BP 134/75 (BP Location: Left Arm, Patient Position: Sitting, Cuff Size: Large)   Pulse 76   Ht 5\' 8"  (1.727 m)   Wt 295 lb (133.8 kg)   SpO2 96%   BMI 44.85 kg/m   Physical Exam Constitutional:      Appearance: Normal appearance.  HENT:     Head: Normocephalic and atraumatic.  Eyes:     General: No scleral icterus. Cardiovascular:     Rate and Rhythm: Normal rate and regular rhythm.  Pulmonary:     Effort: Pulmonary effort is normal.     Breath sounds: Normal breath sounds.  Musculoskeletal:     Cervical back: Neck supple.  Neurological:     Mental Status: He is alert.  Psychiatric:        Mood and Affect: Mood normal.        Behavior: Behavior normal.     ------------------------------------------------------------------------------------------------------------------------------------------------------------------------------------------------------------------- Assessment and Plan  Uncontrolled type 2 diabetes mellitus with hyperglycemia (HCC) Doing well at this time. Last A1c 6.9%.  Continue current medications and encouraged dietary changes.   Status post aortic valve repair He has had some exertional dyspnea.  Echo pending from cardiology.   Hypertension associated with diabetes (HCC) BP is well controlled. He'll continue current medications.   Hyperlipidemia associated with type 2 diabetes mellitus (HCC) Tolerating atorvastatin well, continue current strength.  Dyspnea Cardiology evaluating.  Will get CXR today as well.   UTI (urinary tract infection) Symptoms pretty much resolved at this point.  He will complete antibiotic.    Meds ordered  this encounter  Medications   valsartan (DIOVAN) 160 MG tablet    Sig: Take 1 tablet (160 mg total) by mouth daily.    Dispense:  90 tablet    Refill:  1    Return in about 4 months (around 08/18/2023) for Type 2 Diabetes, Hypertension.    This visit occurred during the SARS-CoV-2 public health emergency.  Safety protocols were in place, including screening questions prior to the visit, additional usage of staff PPE, and extensive cleaning of exam room while observing appropriate contact time as indicated for disinfecting solutions.

## 2023-05-08 ENCOUNTER — Ambulatory Visit: Payer: Self-pay | Admitting: Family Medicine

## 2023-05-08 DIAGNOSIS — Z794 Long term (current) use of insulin: Secondary | ICD-10-CM | POA: Diagnosis not present

## 2023-05-08 DIAGNOSIS — Z950 Presence of cardiac pacemaker: Secondary | ICD-10-CM | POA: Diagnosis not present

## 2023-05-08 DIAGNOSIS — Z79899 Other long term (current) drug therapy: Secondary | ICD-10-CM | POA: Diagnosis not present

## 2023-05-08 DIAGNOSIS — E119 Type 2 diabetes mellitus without complications: Secondary | ICD-10-CM | POA: Diagnosis not present

## 2023-05-08 DIAGNOSIS — J101 Influenza due to other identified influenza virus with other respiratory manifestations: Secondary | ICD-10-CM | POA: Diagnosis not present

## 2023-05-08 DIAGNOSIS — Z7985 Long-term (current) use of injectable non-insulin antidiabetic drugs: Secondary | ICD-10-CM | POA: Diagnosis not present

## 2023-05-08 DIAGNOSIS — R0602 Shortness of breath: Secondary | ICD-10-CM | POA: Diagnosis not present

## 2023-05-08 DIAGNOSIS — Z5329 Procedure and treatment not carried out because of patient's decision for other reasons: Secondary | ICD-10-CM | POA: Diagnosis not present

## 2023-05-08 DIAGNOSIS — I517 Cardiomegaly: Secondary | ICD-10-CM | POA: Diagnosis not present

## 2023-05-08 DIAGNOSIS — Z7982 Long term (current) use of aspirin: Secondary | ICD-10-CM | POA: Diagnosis not present

## 2023-05-08 DIAGNOSIS — R0600 Dyspnea, unspecified: Secondary | ICD-10-CM | POA: Diagnosis not present

## 2023-05-08 DIAGNOSIS — R0989 Other specified symptoms and signs involving the circulatory and respiratory systems: Secondary | ICD-10-CM | POA: Diagnosis not present

## 2023-05-08 DIAGNOSIS — E785 Hyperlipidemia, unspecified: Secondary | ICD-10-CM | POA: Diagnosis not present

## 2023-05-08 DIAGNOSIS — I110 Hypertensive heart disease with heart failure: Secondary | ICD-10-CM | POA: Diagnosis not present

## 2023-05-08 DIAGNOSIS — R0789 Other chest pain: Secondary | ICD-10-CM | POA: Diagnosis not present

## 2023-05-08 DIAGNOSIS — I509 Heart failure, unspecified: Secondary | ICD-10-CM | POA: Diagnosis not present

## 2023-05-08 DIAGNOSIS — R9431 Abnormal electrocardiogram [ECG] [EKG]: Secondary | ICD-10-CM | POA: Diagnosis not present

## 2023-05-08 DIAGNOSIS — I2510 Atherosclerotic heart disease of native coronary artery without angina pectoris: Secondary | ICD-10-CM | POA: Diagnosis not present

## 2023-05-08 DIAGNOSIS — J984 Other disorders of lung: Secondary | ICD-10-CM | POA: Diagnosis not present

## 2023-05-08 NOTE — Telephone Encounter (Signed)
  Chief Complaint: cough , SOB with exertion Symptoms: on going cough x 1 month or more. Only productive at times. Coughing spells reported. Feels weak, wheezing at times.  Frequency: greater than a month Pertinent Negatives: Patient denies chest pain no difficulty breathing no fever Disposition: [] ED /[] Urgent Care (no appt availability in office) / [x] Appointment(In office/virtual)/ []  [place] [facility]/ [] Home Care/ [] Refused Recommended Disposition /[] [place] [facility]/ []  Follow-up with PCP Additional Notes:   No appt with PCP today . Scheduled appt tomorrow. Recommended if sx worsen call back       Copied from CRM [id number]. Topic: Clinical - Red Word Triage >> May 08, 2023  8:03 AM Elle L wrote: Red Word that prompted transfer to Nurse Triage: The patient requested to speak to a nurse as he has worsening congestion and he is concerned that it is pneumonia as he has had it over a month and it is not getting better. Reason for Disposition  [1] MILD difficulty breathing (e.g., minimal/no SOB at rest, SOB with walking, pulse <100) AND [2] still present when not coughing  Answer Assessment - Initial Assessment Questions 1. ONSET: "When did the cough begin?"      Greater than a month ago  2. SEVERITY: "How bad is the cough today?"      Feels congested but not productive  3. SPUTUM: "Describe the color of your sputum" (none, dry cough; clear, white, yellow, green)     Some at times 4. HEMOPTYSIS: "Are you coughing up any blood?" If so ask: "How much?" (flecks, streaks, tablespoons, etc.)     Na  5. DIFFICULTY BREATHING: "Are you having difficulty breathing?" If Yes, ask: "How bad is it?" (e.g., mild, moderate, severe)    - MILD: No SOB at rest, mild SOB with walking, speaks normally in sentences, can lie down, no retractions, pulse < 100.    - MODERATE: SOB at rest, SOB with minimal exertion and prefers to sit, cannot lie down flat, speaks in phrases, mild retractions, audible  wheezing, pulse 100-120.    - SEVERE: Very SOB at rest, speaks in single words, struggling to breathe, sitting hunched forward, retractions, pulse > 120      Moderate SOB unable sleep  6. FEVER: "Do you have a fever?" If Yes, ask: "What is your temperature, how was it measured, and when did it start?"     na 7. CARDIAC HISTORY: "Do you have any history of heart disease?" (e.g., heart attack, congestive heart failure)      na 8. LUNG HISTORY: "Do you have any history of lung disease?"  (e.g., pulmonary embolus, asthma, emphysema)     na 9. PE RISK FACTORS: "Do you have a history of blood clots?" (or: recent major surgery, recent prolonged travel, bedridden)     na 10. OTHER SYMPTOMS: "Do you have any other symptoms?" (e.g., runny nose, wheezing, chest pain)       wheezing 11. PREGNANCY: "Is there any chance you are pregnant?" "When was your last menstrual period?"       na 12. TRAVEL: "Have you traveled out of the country in the last month?" (e.g., travel history, exposures)       na  Protocols used: Cough - Acute Non-Productive-A-AH

## 2023-05-09 ENCOUNTER — Ambulatory Visit: Admitting: Family Medicine

## 2023-05-10 DIAGNOSIS — Z7982 Long term (current) use of aspirin: Secondary | ICD-10-CM | POA: Diagnosis not present

## 2023-05-10 DIAGNOSIS — Z950 Presence of cardiac pacemaker: Secondary | ICD-10-CM | POA: Diagnosis not present

## 2023-05-10 DIAGNOSIS — Z794 Long term (current) use of insulin: Secondary | ICD-10-CM | POA: Diagnosis not present

## 2023-05-10 DIAGNOSIS — Z79899 Other long term (current) drug therapy: Secondary | ICD-10-CM | POA: Diagnosis not present

## 2023-05-10 DIAGNOSIS — Z21 Asymptomatic human immunodeficiency virus [HIV] infection status: Secondary | ICD-10-CM | POA: Diagnosis not present

## 2023-05-10 DIAGNOSIS — Z888 Allergy status to other drugs, medicaments and biological substances status: Secondary | ICD-10-CM | POA: Diagnosis not present

## 2023-05-10 DIAGNOSIS — R062 Wheezing: Secondary | ICD-10-CM | POA: Diagnosis not present

## 2023-05-10 DIAGNOSIS — J101 Influenza due to other identified influenza virus with other respiratory manifestations: Secondary | ICD-10-CM | POA: Diagnosis not present

## 2023-05-10 DIAGNOSIS — E785 Hyperlipidemia, unspecified: Secondary | ICD-10-CM | POA: Diagnosis not present

## 2023-05-10 DIAGNOSIS — I509 Heart failure, unspecified: Secondary | ICD-10-CM | POA: Diagnosis not present

## 2023-05-10 DIAGNOSIS — Z7985 Long-term (current) use of injectable non-insulin antidiabetic drugs: Secondary | ICD-10-CM | POA: Diagnosis not present

## 2023-05-10 DIAGNOSIS — R0600 Dyspnea, unspecified: Secondary | ICD-10-CM | POA: Diagnosis not present

## 2023-05-10 DIAGNOSIS — R600 Localized edema: Secondary | ICD-10-CM | POA: Diagnosis not present

## 2023-05-10 DIAGNOSIS — J189 Pneumonia, unspecified organism: Secondary | ICD-10-CM | POA: Diagnosis not present

## 2023-05-10 DIAGNOSIS — I110 Hypertensive heart disease with heart failure: Secondary | ICD-10-CM | POA: Diagnosis not present

## 2023-05-10 DIAGNOSIS — R0602 Shortness of breath: Secondary | ICD-10-CM | POA: Diagnosis not present

## 2023-05-11 ENCOUNTER — Encounter: Payer: Self-pay | Admitting: Family Medicine

## 2023-05-11 MED ORDER — FUROSEMIDE 20 MG PO TABS: 20.0000 mg | ORAL_TABLET | Freq: Two times a day (BID) | ORAL | 1 refills | Status: DC

## 2023-05-15 DIAGNOSIS — Z952 Presence of prosthetic heart valve: Secondary | ICD-10-CM | POA: Diagnosis not present

## 2023-05-15 DIAGNOSIS — I5030 Unspecified diastolic (congestive) heart failure: Secondary | ICD-10-CM | POA: Diagnosis not present

## 2023-05-15 DIAGNOSIS — Z1330 Encounter for screening examination for mental health and behavioral disorders, unspecified: Secondary | ICD-10-CM | POA: Diagnosis not present

## 2023-05-15 DIAGNOSIS — I2510 Atherosclerotic heart disease of native coronary artery without angina pectoris: Secondary | ICD-10-CM | POA: Diagnosis not present

## 2023-05-23 ENCOUNTER — Other Ambulatory Visit: Payer: Self-pay | Admitting: Family Medicine

## 2023-05-29 NOTE — Progress Notes (Signed)
 06/04/2023 9:20 AM   Clifford Strong 24-Feb-1951 [id number]  Referring provider: Everrett Coombe, DO [address] [place],  [place] [address]  No chief complaint on file.   [id number] is a 73 yo male referred by Everrett Coombe for  E/M of issues with urinary tract infection.  He presented to the emergency room in [place] at [facility] on 13 January, 2025 with frequency, urgency, foul-smelling urine.  No dysuria, no abdominal or flank pain, no fever or chills.  Did have CT stone protocol performed.  Results:  FINDINGS:  VISUALIZED LOWER THORAX: No acute abnormalities. Pacemaker. Aortic valve replacements. Trace pleural fluid collections.  UROLOGIC: Kidneys/ureters: Some cortical calcifications in the right and left kidneys. No calyceal stones are seen. Small cyst in the lateral left kidney. Small exophytic cyst off the posterior hilar lip on the right. Right ureter is normal. Left ureter not distended. There is focal area where the left ureter appears dilated and surrounded by some edema. There are some adjacent lymph nodes in this region as well. The distal left ureter is normal. No hydronephrosis.  The appearance is unchanged from previous study but is of uncertain etiology. Urinary bladder:There is posterior lateral diverticulum off the left side of the bladder, unchanged. Bladder is very contracted.   IMPRESSION: 1.  Stable focal area of enlargement of the left ureter or hydroureter is surrounded by focal soft tissue. This could be an unusual ureteral spindle (normal focal fusiform ureteral dilation), but other etiologies are not excluded. There is some stranding in this region of the left lower quadrant, that extends down to the bladder. 2.  There is a bladder diverticulum on the left. 3.  Benign renal calcifications and renal cysts. 4.  Possible bilateral adrenal adenomas. 5.  Splenic granulomata and splenomegaly. 6.  Mild prostatic enlargement.  E.  coli UTI was treated.  The E. coli was resistant to Bactrim, intermediate to quinolones.  Symptoms temporarily improved with initial antibiotic management, but recurred shortly thereafter.  He then presented to Dr. Ashley Royalty on February 5.  Again, E. coli grew out with similar sensitivities.  He was given a 10-day course of cefdinir.  Symptoms abated, and he has had no problems since.  He denies high risk behavior for urinary carcinomas.  No current gross hematuria.     Recent PSA from February 2025 was 1.0. PMH: Past Medical History:  Diagnosis Date   Aortic stenosis    Degenerative joint disease (DJD) of hip 04/09/2017   Diabetes mellitus without complication (HCC)    Heart murmur    HIV infection (HCC)    Hypertension    Obesity     Surgical History: Past Surgical History:  Procedure Laterality Date   AORTIC VALVE REPLACEMENT  2014    Home Medications:  Allergies as of 06/04/2023       Reactions   Lisinopril Cough        Medication List        Accurate as of May 29, 2023  9:20 AM. If you have any questions, ask your nurse or doctor.          aspirin EC 81 MG tablet Take 1 tablet (81 mg total) by mouth daily.   atorvastatin 40 MG tablet Commonly known as: LIPITOR Take 1 tablet (40 mg total) by mouth daily.   Biktarvy 50-200-25 MG Tabs tablet Generic drug: bictegravir-emtricitabine-tenofovir AF Take 1 tablet by mouth daily.   carvedilol 25 MG tablet Commonly known as: COREG  TAKE 1 TABLET TWICE DAILY WITH MEALS   Droplet Pen Needles 32G X 4 MM Misc Generic drug: Insulin Pen Needle USE WITH TRESIBA TO GIVE INSULIN.   DSS 100 MG Caps Take by mouth.   Ergocalciferol 50 MCG (2000 UT) Caps Take 1 capsule by mouth daily.   furosemide 20 MG tablet Commonly known as: LASIX Take 1 tablet (20 mg total) by mouth 2 (two) times daily.   magnesium oxide 400 (240 Mg) MG tablet Commonly known as: MAG-OX Take 1 tablet by mouth daily.   onetouch ultrasoft  lancets Use as instructed to check blood sugars 3 times daily DX E11.9 patient has one touch ultra mini   Ozempic (2 MG/DOSE) 8 MG/3ML Sopn Generic drug: Semaglutide (2 MG/DOSE) INJECT 2MG  UNDER THE SKIN ONE TIME WEEKLY AS DIRECTED   Toujeo Max SoloStar 300 UNIT/ML Solostar Pen Generic drug: insulin glargine (2 Unit Dial) INJECT 54 UNITS INTO THE SKIN 2 (TWO) TIMES DAILY   valsartan 160 MG tablet Commonly known as: DIOVAN Take 1 tablet (160 mg total) by mouth daily.        Allergies:  Allergies  Allergen Reactions   Lisinopril Cough    Family History: Family History  Problem Relation Age of Onset   Diabetes Mother    Hypertension Mother    Heart attack Mother    Diabetes Father    Hypertension Father    Lung disease Father     Social History:  reports that he has never smoked. He has never used smokeless tobacco. He reports that he does not drink alcohol and does not use drugs.  ROS: All other review of systems were reviewed and are negative except what is noted above in HPI  Physical Exam: There were no vitals taken for this visit.  Constitutional:  Alert and oriented, No acute distress. HEENT: [place] AT, moist mucus membranes.  Trachea midline, no masses. Cardiovascular: No clubbing, cyanosis, or edema. Respiratory: Normal respiratory effort, no increased work of breathing. GI: Morbidly obese Lymph: No cervical or inguinal lymphadenopathy. Skin: No rashes, bruises or suspicious lesions. Neurologic: Grossly intact, no focal deficits, moving all 4 extremities. Psychiatric: Normal mood and affect.  Laboratory Data: Lab Results  Component Value Date   WBC 6.5 01/19/2022   HGB 13.6 01/19/2022   HCT 39.7 01/19/2022   MCV 91.3 01/19/2022   PLT 203 01/19/2022    Lab Results  Component Value Date   CREATININE 1.12 04/11/2023    Lab Results  Component Value Date   PSA 0.94 01/19/2022    No results found for: "TESTOSTERONE"  Lab Results  Component Value  Date   HGBA1C 6.9 02/16/2023    Urinalysis    Component Value Date/Time   BILIRUBINUR negative 04/11/2023 1458   KETONESUR negative 04/11/2023 1458   UROBILINOGEN 0.2 04/11/2023 1458   NITRITE Positive (A) 04/11/2023 1458   LEUKOCYTESUR Large (3+) (A) 04/11/2023 1458    No results found for: "LABMICR", "WBCUA", "RBCUA", "LABEPIT", "MUCUS", "BACTERIA"  Pertinent Imaging: CT results reviewed  IPSS sheet reviewed-2/0  Urinalysis not available today.  Bladder scan volume 1 mL   Assessment:  1.  UTI, resolved based on symptoms.  Urinalysis not available today.  He is diabetic which increases risk for UTI, but he empties well and currently no real urinary symptoms.  2.  Left ureteral abnormality on CT.  Apparently stable but may well need follow-up  Plan:  1.  I will have him drop in for urinalysis in 2 to  3 weeks.  2.  If urinalysis shows microscopic hematuria or worry about left ureteral appearance (we will attempt to get a disc of results and), consider CT hematuria protocol  3.  Default appointment in 6 months Chelsea Aus, MD  [facility] [place]

## 2023-06-04 ENCOUNTER — Ambulatory Visit (INDEPENDENT_AMBULATORY_CARE_PROVIDER_SITE_OTHER): Payer: Medicare Other | Admitting: Urology

## 2023-06-04 ENCOUNTER — Encounter: Payer: Self-pay | Admitting: Urology

## 2023-06-04 VITALS — BP 144/95 | HR 96 | Ht 69.0 in | Wt 285.0 lb

## 2023-06-04 DIAGNOSIS — R9341 Abnormal radiologic findings on diagnostic imaging of renal pelvis, ureter, or bladder: Secondary | ICD-10-CM | POA: Diagnosis not present

## 2023-06-04 DIAGNOSIS — N138 Other obstructive and reflux uropathy: Secondary | ICD-10-CM

## 2023-06-04 DIAGNOSIS — Z87440 Personal history of urinary (tract) infections: Secondary | ICD-10-CM | POA: Diagnosis not present

## 2023-06-04 LAB — BLADDER SCAN AMB NON-IMAGING

## 2023-06-06 ENCOUNTER — Telehealth: Payer: Self-pay | Admitting: Urology

## 2023-06-06 NOTE — Telephone Encounter (Signed)
 D[place]t - Please see if we can get CT disc from [facility].   I LVM on pt phone asking him to call office back. If patient calls back, please relay information; pt needs to call and request a copy of the disc and drop it off to our office.

## 2023-06-06 NOTE — Telephone Encounter (Signed)
 Called to make pt aware of me moving his appt to the 29th due to my mistake of scheduling him with the wrong provider.

## 2023-06-20 ENCOUNTER — Other Ambulatory Visit

## 2023-06-22 ENCOUNTER — Other Ambulatory Visit: Payer: Self-pay | Admitting: Family Medicine

## 2023-06-26 ENCOUNTER — Other Ambulatory Visit: Payer: Self-pay

## 2023-06-26 DIAGNOSIS — N138 Other obstructive and reflux uropathy: Secondary | ICD-10-CM

## 2023-06-27 ENCOUNTER — Other Ambulatory Visit

## 2023-06-27 DIAGNOSIS — N401 Enlarged prostate with lower urinary tract symptoms: Secondary | ICD-10-CM

## 2023-06-27 DIAGNOSIS — N138 Other obstructive and reflux uropathy: Secondary | ICD-10-CM | POA: Diagnosis not present

## 2023-06-27 LAB — URINALYSIS, ROUTINE W REFLEX MICROSCOPIC
Bilirubin, UA: NEGATIVE
Glucose, UA: NEGATIVE
Ketones, UA: NEGATIVE
Leukocytes,UA: NEGATIVE
Nitrite, UA: NEGATIVE
Protein,UA: NEGATIVE
RBC, UA: NEGATIVE
Specific Gravity, UA: 1.01 (ref 1.005–1.030)
Urobilinogen, Ur: 0.2 mg/dL (ref 0.2–1.0)
pH, UA: 5.5 (ref 5.0–7.5)

## 2023-07-06 ENCOUNTER — Other Ambulatory Visit: Payer: Self-pay | Admitting: Family Medicine

## 2023-07-06 DIAGNOSIS — Z950 Presence of cardiac pacemaker: Secondary | ICD-10-CM | POA: Diagnosis not present

## 2023-07-06 DIAGNOSIS — Z952 Presence of prosthetic heart valve: Secondary | ICD-10-CM | POA: Diagnosis not present

## 2023-07-06 DIAGNOSIS — Z953 Presence of xenogenic heart valve: Secondary | ICD-10-CM | POA: Diagnosis not present

## 2023-07-06 DIAGNOSIS — I10 Essential (primary) hypertension: Secondary | ICD-10-CM | POA: Diagnosis not present

## 2023-07-12 DIAGNOSIS — I081 Rheumatic disorders of both mitral and tricuspid valves: Secondary | ICD-10-CM | POA: Diagnosis not present

## 2023-07-12 DIAGNOSIS — I342 Nonrheumatic mitral (valve) stenosis: Secondary | ICD-10-CM | POA: Diagnosis not present

## 2023-07-12 DIAGNOSIS — E782 Mixed hyperlipidemia: Secondary | ICD-10-CM | POA: Diagnosis not present

## 2023-07-12 DIAGNOSIS — Z7985 Long-term (current) use of injectable non-insulin antidiabetic drugs: Secondary | ICD-10-CM | POA: Diagnosis not present

## 2023-07-12 DIAGNOSIS — I5030 Unspecified diastolic (congestive) heart failure: Secondary | ICD-10-CM | POA: Diagnosis not present

## 2023-07-12 DIAGNOSIS — R0602 Shortness of breath: Secondary | ICD-10-CM | POA: Diagnosis not present

## 2023-07-12 DIAGNOSIS — E1165 Type 2 diabetes mellitus with hyperglycemia: Secondary | ICD-10-CM | POA: Diagnosis not present

## 2023-07-12 DIAGNOSIS — J209 Acute bronchitis, unspecified: Secondary | ICD-10-CM | POA: Diagnosis not present

## 2023-07-12 DIAGNOSIS — I2510 Atherosclerotic heart disease of native coronary artery without angina pectoris: Secondary | ICD-10-CM | POA: Diagnosis not present

## 2023-07-12 DIAGNOSIS — E119 Type 2 diabetes mellitus without complications: Secondary | ICD-10-CM | POA: Diagnosis not present

## 2023-07-12 DIAGNOSIS — Z87891 Personal history of nicotine dependence: Secondary | ICD-10-CM | POA: Diagnosis not present

## 2023-07-12 DIAGNOSIS — I10 Essential (primary) hypertension: Secondary | ICD-10-CM | POA: Diagnosis not present

## 2023-07-12 DIAGNOSIS — I5033 Acute on chronic diastolic (congestive) heart failure: Secondary | ICD-10-CM | POA: Diagnosis not present

## 2023-07-12 DIAGNOSIS — E871 Hypo-osmolality and hyponatremia: Secondary | ICD-10-CM | POA: Diagnosis not present

## 2023-07-12 DIAGNOSIS — J1001 Influenza due to other identified influenza virus with the same other identified influenza virus pneumonia: Secondary | ICD-10-CM | POA: Diagnosis not present

## 2023-07-12 DIAGNOSIS — E785 Hyperlipidemia, unspecified: Secondary | ICD-10-CM | POA: Diagnosis not present

## 2023-07-12 DIAGNOSIS — E1101 Type 2 diabetes mellitus with hyperosmolarity with coma: Secondary | ICD-10-CM | POA: Diagnosis not present

## 2023-07-12 DIAGNOSIS — J9601 Acute respiratory failure with hypoxia: Secondary | ICD-10-CM | POA: Diagnosis not present

## 2023-07-12 DIAGNOSIS — Z7982 Long term (current) use of aspirin: Secondary | ICD-10-CM | POA: Diagnosis not present

## 2023-07-12 DIAGNOSIS — Z794 Long term (current) use of insulin: Secondary | ICD-10-CM | POA: Diagnosis not present

## 2023-07-12 DIAGNOSIS — Z953 Presence of xenogenic heart valve: Secondary | ICD-10-CM | POA: Diagnosis not present

## 2023-07-12 DIAGNOSIS — Z7984 Long term (current) use of oral hypoglycemic drugs: Secondary | ICD-10-CM | POA: Diagnosis not present

## 2023-07-12 DIAGNOSIS — R0989 Other specified symptoms and signs involving the circulatory and respiratory systems: Secondary | ICD-10-CM | POA: Diagnosis not present

## 2023-07-12 DIAGNOSIS — Z6841 Body Mass Index (BMI) 40.0 and over, adult: Secondary | ICD-10-CM | POA: Diagnosis not present

## 2023-07-12 DIAGNOSIS — R918 Other nonspecific abnormal finding of lung field: Secondary | ICD-10-CM | POA: Diagnosis not present

## 2023-07-12 DIAGNOSIS — E873 Alkalosis: Secondary | ICD-10-CM | POA: Diagnosis not present

## 2023-07-12 DIAGNOSIS — I77810 Thoracic aortic ectasia: Secondary | ICD-10-CM | POA: Diagnosis not present

## 2023-07-12 DIAGNOSIS — I350 Nonrheumatic aortic (valve) stenosis: Secondary | ICD-10-CM | POA: Diagnosis not present

## 2023-07-12 DIAGNOSIS — I442 Atrioventricular block, complete: Secondary | ICD-10-CM | POA: Diagnosis not present

## 2023-07-12 DIAGNOSIS — R059 Cough, unspecified: Secondary | ICD-10-CM | POA: Diagnosis not present

## 2023-07-12 DIAGNOSIS — B20 Human immunodeficiency virus [HIV] disease: Secondary | ICD-10-CM | POA: Diagnosis not present

## 2023-07-12 DIAGNOSIS — I509 Heart failure, unspecified: Secondary | ICD-10-CM | POA: Diagnosis not present

## 2023-07-12 DIAGNOSIS — E66812 Obesity, class 2: Secondary | ICD-10-CM | POA: Diagnosis not present

## 2023-07-12 DIAGNOSIS — Z79899 Other long term (current) drug therapy: Secondary | ICD-10-CM | POA: Diagnosis not present

## 2023-07-12 DIAGNOSIS — A0811 Acute gastroenteropathy due to Norwalk agent: Secondary | ICD-10-CM | POA: Diagnosis not present

## 2023-07-12 DIAGNOSIS — I517 Cardiomegaly: Secondary | ICD-10-CM | POA: Diagnosis not present

## 2023-07-12 DIAGNOSIS — R55 Syncope and collapse: Secondary | ICD-10-CM | POA: Diagnosis not present

## 2023-07-12 DIAGNOSIS — Z950 Presence of cardiac pacemaker: Secondary | ICD-10-CM | POA: Diagnosis not present

## 2023-07-12 DIAGNOSIS — E878 Other disorders of electrolyte and fluid balance, not elsewhere classified: Secondary | ICD-10-CM | POA: Diagnosis not present

## 2023-07-12 DIAGNOSIS — I110 Hypertensive heart disease with heart failure: Secondary | ICD-10-CM | POA: Diagnosis not present

## 2023-07-12 DIAGNOSIS — I447 Left bundle-branch block, unspecified: Secondary | ICD-10-CM | POA: Diagnosis not present

## 2023-07-12 DIAGNOSIS — I7121 Aneurysm of the ascending aorta, without rupture: Secondary | ICD-10-CM | POA: Diagnosis not present

## 2023-07-13 ENCOUNTER — Telehealth: Payer: Self-pay

## 2023-07-13 DIAGNOSIS — I5030 Unspecified diastolic (congestive) heart failure: Secondary | ICD-10-CM | POA: Diagnosis not present

## 2023-07-13 DIAGNOSIS — Z953 Presence of xenogenic heart valve: Secondary | ICD-10-CM | POA: Diagnosis not present

## 2023-07-13 DIAGNOSIS — Z950 Presence of cardiac pacemaker: Secondary | ICD-10-CM | POA: Diagnosis not present

## 2023-07-13 DIAGNOSIS — I10 Essential (primary) hypertension: Secondary | ICD-10-CM | POA: Diagnosis not present

## 2023-07-13 NOTE — Transitions of Care (Post Inpatient/ED Visit) (Unsigned)
   07/13/2023  Name: Clifford Strong MRN: [id number] DOB: 1950/07/27  Today's TOC FU Call Status: Today's TOC FU Call Status:: Unsuccessful Call (1st Attempt) Unsuccessful Call (1st Attempt) Date: 07/13/23  Attempted to reach the patient regarding the most recent Inpatient/ED visit.  Follow Up Plan: Additional outreach attempts will be made to reach the patient to complete the Transitions of Care (Post Inpatient/ED visit) call.   Signature Darrall Ellison, LPN [facility] Nurse Health Advisor Direct Dial [phone]

## 2023-07-16 NOTE — Transitions of Care (Post Inpatient/ED Visit) (Unsigned)
   07/16/2023  Name: Clifford Strong MRN: [id number] DOB: 01/24/1951  Today's TOC FU Call Status: Today's TOC FU Call Status:: Unsuccessful Call (2nd Attempt) Unsuccessful Call (1st Attempt) Date: 07/13/23 Unsuccessful Call (2nd Attempt) Date: 07/16/23  Attempted to reach the patient regarding the most recent Inpatient/ED visit.  Follow Up Plan: Additional outreach attempts will be made to reach the patient to complete the Transitions of Care (Post Inpatient/ED visit) call.   Signature Darrall Ellison, LPN [facility] Nurse Health Advisor Direct Dial [phone]

## 2023-07-17 DIAGNOSIS — I342 Nonrheumatic mitral (valve) stenosis: Secondary | ICD-10-CM | POA: Insufficient documentation

## 2023-07-17 NOTE — Transitions of Care (Post Inpatient/ED Visit) (Signed)
 07/17/2023  Name: Clifford Strong MRN: [id number] DOB: 01-Feb-1951  Today's TOC FU Call Status: Today's TOC FU Call Status:: Successful TOC FU Call Completed Unsuccessful Call (1st Attempt) Date: 07/13/23 Unsuccessful Call (2nd Attempt) Date: 07/16/23 [facility] FU Call Complete Date: 07/17/23 Patient's Name and Date of Birth confirmed.  Transition Care Management Follow-up Telephone Call Date of Discharge: 07/12/23 Discharge Facility: Other ([facility]) Name of Other ([facility]) Discharge Facility: [facility] Type of Discharge: Emergency Department Reason for ED Visit: Other: (bronchitis) How have you been since you were released from the hospital?: Better Any questions or concerns?: No  Items Reviewed: Did you receive and understand the discharge instructions provided?: Yes Medications obtained,verified, and reconciled?: Yes (Medications Reviewed) Any new allergies since your discharge?: No Dietary orders reviewed?: NA Do you have support at home?: Yes People in Home [RPT]: spouse  Medications Reviewed Today: Medications Reviewed Today     Reviewed by Darrall Ellison, LPN (Licensed Practical Nurse) on 07/17/23 at 1151  Med List Status: <None>   Medication Order Taking? Sig Documenting Provider Last Dose Status Informant  aspirin  EC 81 MG tablet [id number] No Take 1 tablet (81 mg total) by mouth daily. Alexander, Natalie, DO Taking Active   atorvastatin  (LIPITOR) 40 MG tablet [id number] No Take 1 tablet (40 mg total) by mouth daily. Alexander, Natalie, DO Taking Active   BIKTARVY 50-200-25 MG TABS tablet [id number] No Take 1 tablet by mouth daily. [provider] Taking Active   carvedilol  (COREG ) 25 MG tablet [id number] No TAKE 1 TABLET TWICE DAILY WITH MEALS Matthews, Cody, DO Taking Active   Docusate Sodium (DSS) 100 MG CAPS [id number] No Take by mouth. [provider] Taking Active   DROPLET PEN NEEDLES 32G X 4 MM MISC [id number] No USE WITH TRESIBA  TO GIVE INSULIN .  Adela Holter, DO Taking Active   Ergocalciferol  2000 units CAPS [id number] No Take 1 capsule by mouth daily. Jari Merles, PA-C Taking Active   furosemide  (LASIX ) 20 MG tablet [id number] No Take 1 tablet (20 mg total) by mouth 2 (two) times daily. Adela Holter, DO Taking Active   Lancets [facility] ULTRASOFT) lancets [id number] No Use as instructed to check blood sugars 3 times daily DX E11.9 patient has one touch ultra mini Melodi Sprung, DO Taking Active   magnesium oxide (MAG-OX) 400 (240 Mg) MG tablet [id number] No Take 1 tablet by mouth daily. [provider] Taking Active   Semaglutide , 2 MG/DOSE, (OZEMPIC , 2 MG/DOSE,) 8 MG/3ML SOPN [id number]  INJECT 2MG  UNDER THE SKIN ONE TIME WEEKLY AS DIRECTED Adela Holter, DO  Active   TOUJEO  MAX SOLOSTAR 300 UNIT/ML Solostar Pen [id number] No INJECT 54 UNITS INTO THE SKIN 2 (TWO) TIMES DAILY Adela Holter, DO Taking Active   valsartan  (DIOVAN ) 160 MG tablet [id number] No Take 1 tablet (160 mg total) by mouth daily. Adela Holter, DO Taking Active             Home Care and Equipment/Supplies: Were Home Health Services Ordered?: NA Any new equipment or medical supplies ordered?: NA  Functional Questionnaire: Do you need assistance with bathing/showering or dressing?: No Do you need assistance with meal preparation?: No Do you need assistance with eating?: No Do you have difficulty maintaining continence: No Do you need assistance with getting out of bed/getting out of a chair/moving?: No Do you have difficulty managing or taking your medications?: No  Follow up appointments reviewed: PCP Follow-up appointment confirmed?: No (declined) MD Provider Line Number:[phone] Given: No Specialist Hospital Follow-up appointment confirmed?:  NA Do you need transportation to your follow-up appointment?: No Do you understand care options if your condition(s) worsen?: Yes-patient verbalized understanding    SIGNATURE  Darrall Ellison, LPN [facility] Nurse Health Advisor Direct Dial [phone]

## 2023-07-25 ENCOUNTER — Telehealth: Payer: Self-pay

## 2023-07-25 NOTE — Transitions of Care (Post Inpatient/ED Visit) (Signed)
   07/25/2023  Name: Clifford Strong MRN: [id number] DOB: 10-24-1950  Today's TOC FU Call Status: Today's TOC FU Call Status:: Successful TOC FU Call Completed TOC FU Call Complete Date: 07/25/23 [facility] RN spoke with patient who said he has reviewed his discharge instructions and understands to follow up with MD and declined to participate in call.) Patient's Name and Date of Birth confirmed.  Tonia Frankel RN, CCM [place]  VBCI-Population Health RN Care Manager [phone]

## 2023-07-31 DIAGNOSIS — I10 Essential (primary) hypertension: Secondary | ICD-10-CM | POA: Diagnosis not present

## 2023-07-31 DIAGNOSIS — E782 Mixed hyperlipidemia: Secondary | ICD-10-CM | POA: Diagnosis not present

## 2023-07-31 DIAGNOSIS — I509 Heart failure, unspecified: Secondary | ICD-10-CM | POA: Diagnosis not present

## 2023-07-31 DIAGNOSIS — I5032 Chronic diastolic (congestive) heart failure: Secondary | ICD-10-CM | POA: Diagnosis not present

## 2023-07-31 DIAGNOSIS — I459 Conduction disorder, unspecified: Secondary | ICD-10-CM | POA: Diagnosis not present

## 2023-07-31 DIAGNOSIS — Z953 Presence of xenogenic heart valve: Secondary | ICD-10-CM | POA: Diagnosis not present

## 2023-08-10 DIAGNOSIS — B348 Other viral infections of unspecified site: Secondary | ICD-10-CM | POA: Diagnosis not present

## 2023-08-10 DIAGNOSIS — Z794 Long term (current) use of insulin: Secondary | ICD-10-CM | POA: Diagnosis not present

## 2023-08-10 DIAGNOSIS — B20 Human immunodeficiency virus [HIV] disease: Secondary | ICD-10-CM | POA: Diagnosis not present

## 2023-08-10 DIAGNOSIS — Z952 Presence of prosthetic heart valve: Secondary | ICD-10-CM | POA: Diagnosis not present

## 2023-08-10 DIAGNOSIS — E785 Hyperlipidemia, unspecified: Secondary | ICD-10-CM | POA: Diagnosis not present

## 2023-08-10 DIAGNOSIS — A0811 Acute gastroenteropathy due to Norwalk agent: Secondary | ICD-10-CM | POA: Diagnosis not present

## 2023-08-10 DIAGNOSIS — E1165 Type 2 diabetes mellitus with hyperglycemia: Secondary | ICD-10-CM | POA: Diagnosis not present

## 2023-08-10 DIAGNOSIS — I10 Essential (primary) hypertension: Secondary | ICD-10-CM | POA: Diagnosis not present

## 2023-08-17 ENCOUNTER — Ambulatory Visit: Payer: Medicare Other | Admitting: Family Medicine

## 2023-08-17 DIAGNOSIS — J9601 Acute respiratory failure with hypoxia: Secondary | ICD-10-CM | POA: Diagnosis not present

## 2023-08-17 DIAGNOSIS — N182 Chronic kidney disease, stage 2 (mild): Secondary | ICD-10-CM | POA: Insufficient documentation

## 2023-08-17 DIAGNOSIS — E662 Morbid (severe) obesity with alveolar hypoventilation: Secondary | ICD-10-CM | POA: Diagnosis not present

## 2023-08-17 DIAGNOSIS — J8410 Pulmonary fibrosis, unspecified: Secondary | ICD-10-CM | POA: Diagnosis not present

## 2023-08-17 DIAGNOSIS — E871 Hypo-osmolality and hyponatremia: Secondary | ICD-10-CM | POA: Diagnosis not present

## 2023-08-17 DIAGNOSIS — E860 Dehydration: Secondary | ICD-10-CM | POA: Diagnosis not present

## 2023-08-17 DIAGNOSIS — E1165 Type 2 diabetes mellitus with hyperglycemia: Secondary | ICD-10-CM | POA: Diagnosis not present

## 2023-08-17 DIAGNOSIS — Z452 Encounter for adjustment and management of vascular access device: Secondary | ICD-10-CM | POA: Diagnosis not present

## 2023-08-17 DIAGNOSIS — Z952 Presence of prosthetic heart valve: Secondary | ICD-10-CM | POA: Diagnosis not present

## 2023-08-17 DIAGNOSIS — I517 Cardiomegaly: Secondary | ICD-10-CM | POA: Diagnosis not present

## 2023-08-17 DIAGNOSIS — K7581 Nonalcoholic steatohepatitis (NASH): Secondary | ICD-10-CM | POA: Diagnosis not present

## 2023-08-17 DIAGNOSIS — Z953 Presence of xenogenic heart valve: Secondary | ICD-10-CM | POA: Diagnosis not present

## 2023-08-17 DIAGNOSIS — I2510 Atherosclerotic heart disease of native coronary artery without angina pectoris: Secondary | ICD-10-CM | POA: Diagnosis not present

## 2023-08-17 DIAGNOSIS — I10 Essential (primary) hypertension: Secondary | ICD-10-CM | POA: Diagnosis not present

## 2023-08-17 DIAGNOSIS — B20 Human immunodeficiency virus [HIV] disease: Secondary | ICD-10-CM | POA: Diagnosis not present

## 2023-08-17 DIAGNOSIS — Z6841 Body Mass Index (BMI) 40.0 and over, adult: Secondary | ICD-10-CM | POA: Diagnosis not present

## 2023-08-17 DIAGNOSIS — I459 Conduction disorder, unspecified: Secondary | ICD-10-CM | POA: Diagnosis not present

## 2023-08-17 DIAGNOSIS — E785 Hyperlipidemia, unspecified: Secondary | ICD-10-CM | POA: Diagnosis not present

## 2023-08-17 DIAGNOSIS — I5030 Unspecified diastolic (congestive) heart failure: Secondary | ICD-10-CM | POA: Diagnosis not present

## 2023-08-17 DIAGNOSIS — Z7409 Other reduced mobility: Secondary | ICD-10-CM | POA: Diagnosis not present

## 2023-08-17 DIAGNOSIS — I959 Hypotension, unspecified: Secondary | ICD-10-CM | POA: Diagnosis not present

## 2023-08-17 DIAGNOSIS — R0602 Shortness of breath: Secondary | ICD-10-CM | POA: Diagnosis not present

## 2023-08-17 DIAGNOSIS — I7121 Aneurysm of the ascending aorta, without rupture: Secondary | ICD-10-CM | POA: Diagnosis not present

## 2023-08-17 DIAGNOSIS — Z21 Asymptomatic human immunodeficiency virus [HIV] infection status: Secondary | ICD-10-CM | POA: Diagnosis not present

## 2023-08-17 DIAGNOSIS — Z794 Long term (current) use of insulin: Secondary | ICD-10-CM | POA: Diagnosis not present

## 2023-08-17 DIAGNOSIS — J90 Pleural effusion, not elsewhere classified: Secondary | ICD-10-CM | POA: Diagnosis not present

## 2023-08-17 DIAGNOSIS — I050 Rheumatic mitral stenosis: Secondary | ICD-10-CM | POA: Diagnosis not present

## 2023-08-17 DIAGNOSIS — Z950 Presence of cardiac pacemaker: Secondary | ICD-10-CM | POA: Diagnosis not present

## 2023-08-17 DIAGNOSIS — J811 Chronic pulmonary edema: Secondary | ICD-10-CM | POA: Diagnosis not present

## 2023-08-17 DIAGNOSIS — E1122 Type 2 diabetes mellitus with diabetic chronic kidney disease: Secondary | ICD-10-CM | POA: Diagnosis not present

## 2023-08-17 DIAGNOSIS — N179 Acute kidney failure, unspecified: Secondary | ICD-10-CM | POA: Diagnosis not present

## 2023-08-17 DIAGNOSIS — I5032 Chronic diastolic (congestive) heart failure: Secondary | ICD-10-CM | POA: Diagnosis not present

## 2023-08-17 DIAGNOSIS — I130 Hypertensive heart and chronic kidney disease with heart failure and stage 1 through stage 4 chronic kidney disease, or unspecified chronic kidney disease: Secondary | ICD-10-CM | POA: Diagnosis not present

## 2023-08-17 DIAGNOSIS — R54 Age-related physical debility: Secondary | ICD-10-CM | POA: Diagnosis not present

## 2023-08-21 ENCOUNTER — Telehealth: Payer: Self-pay

## 2023-08-21 NOTE — Transitions of Care (Post Inpatient/ED Visit) (Signed)
   08/21/2023  Name: Clifford Strong MRN: [id number] DOB: 29-Jul-1950  Today's TOC FU Call Status: Today's TOC FU Call Status:: Successful TOC FU Call Completed TOC FU Call Complete Date: 08/21/23 (Call back from patient's number x 3 - No VM left - call back to number - Spoke with patient-explained purpose of call and patient declined) Patient's Name and Date of Birth confirmed.  Tonia Frankel RN, CCM [place]  VBCI-Population Health RN Care Manager [phone]

## 2023-08-21 NOTE — Transitions of Care (Post Inpatient/ED Visit) (Signed)
   08/21/2023  Name: Clifford Strong MRN: [id number] DOB: 12-15-1950  Today's TOC FU Call Status: Today's TOC FU Call Status:: Unsuccessful Call (1st Attempt) Unsuccessful Call (1st Attempt) Date: 08/21/23  Attempted to reach the patient regarding the most recent Inpatient/ED visit.  Follow Up Plan: Additional outreach attempts will be made to reach the patient to complete the Transitions of Care (Post Inpatient/ED visit) call.   Tonia Frankel RN, CCM [place]  VBCI-Population Health RN Care Manager [phone]

## 2023-08-28 DIAGNOSIS — I5032 Chronic diastolic (congestive) heart failure: Secondary | ICD-10-CM | POA: Diagnosis not present

## 2023-08-29 ENCOUNTER — Encounter: Payer: Self-pay | Admitting: Family Medicine

## 2023-08-29 ENCOUNTER — Ambulatory Visit

## 2023-08-29 ENCOUNTER — Ambulatory Visit (INDEPENDENT_AMBULATORY_CARE_PROVIDER_SITE_OTHER): Admitting: Family Medicine

## 2023-08-29 VITALS — BP 93/58 | HR 48 | Ht 69.0 in | Wt 271.0 lb

## 2023-08-29 DIAGNOSIS — I509 Heart failure, unspecified: Secondary | ICD-10-CM | POA: Diagnosis not present

## 2023-08-29 DIAGNOSIS — I5033 Acute on chronic diastolic (congestive) heart failure: Secondary | ICD-10-CM

## 2023-08-29 DIAGNOSIS — E1165 Type 2 diabetes mellitus with hyperglycemia: Secondary | ICD-10-CM

## 2023-08-29 DIAGNOSIS — Z7985 Long-term (current) use of injectable non-insulin antidiabetic drugs: Secondary | ICD-10-CM | POA: Diagnosis not present

## 2023-08-29 DIAGNOSIS — R0602 Shortness of breath: Secondary | ICD-10-CM

## 2023-08-29 NOTE — Assessment & Plan Note (Signed)
 Update A1c.  Continue on Ozempic  and Toujeo  at current strength.

## 2023-08-29 NOTE — Progress Notes (Signed)
 Clifford Strong - 73 y.o. male MRN [id number]  Date of birth: 1950-03-31  Subjective Chief Complaint  Patient presents with   Hospitalization Follow-up    HPI Clifford Strong is a 73 year old male here today for hospital follow-up.  Recently hospitalized for CHF exacerbation.  Seen by cardiology yesterday for follow-up.  He has been holding valsartan  and carvedilol  due to hypotension as well as abnormal renal function.  Lasix  was resumed at 20 mg daily.  They wanted us  to obtain repeat labs as well as chest x-ray today.  Reports that he continues to feel fatigued.  Denies difficulty breathing or new chest pain.  Remains on semaglutide  and Toujeo  for management of diabetes.  Tolerating these well at this point.  Reports blood sugars at home have been pretty well controlled.  ROS:  A comprehensive ROS was completed and negative except as noted per HPI  Allergies  Allergen Reactions   Lisinopril Cough    Past Medical History:  Diagnosis Date   Aortic stenosis    Degenerative joint disease (DJD) of hip 04/09/2017   Diabetes mellitus without complication (HCC)    Heart murmur    HIV infection (HCC)    Hypertension    Obesity     Past Surgical History:  Procedure Laterality Date   AORTIC VALVE REPLACEMENT  2014    Social History   Socioeconomic History   Marital status: Married    Spouse name: Not on file   Number of children: Not on file   Years of education: Not on file   Highest education level: 12th grade  Occupational History   Not on file  Tobacco Use   Smoking status: Never   Smokeless tobacco: Never  Vaping Use   Vaping status: Never Used  Substance and Sexual Activity   Alcohol use: No   Drug use: No   Sexual activity: Never    Partners: Female  Other Topics Concern   Not on file  Social History Narrative   Not on file   Social Drivers of Health   Financial Resource Strain: Low Risk  (07/03/2023)   Received from [facility]   Overall Financial Resource  Strain (CARDIA)    Difficulty of Paying Living Expenses: Not hard at all  Food Insecurity: No Food Insecurity (08/17/2023)   Received from [facility]   Hunger Vital Sign    Within the past 12 months, you worried that your food would run out before you got the money to buy more.: Never true    Within the past 12 months, the food you bought just didn't last and you didn't have money to get more.: Never true  Transportation Needs: No Transportation Needs (08/18/2023)   Received from [facility] - Transportation    Lack of Transportation (Medical): No    Lack of Transportation (Non-Medical): No  Physical Activity: Insufficiently Active (07/03/2023)   Received from [facility]   Exercise Vital Sign    On average, how many days per week do you engage in moderate to strenuous exercise (like a brisk walk)?: 4 days    On average, how many minutes do you engage in exercise at this level?: 30 min  Stress: No Stress Concern Present (08/17/2023)   Received from [facility] - Occupational Stress Questionnaire    Feeling of Stress : Not at all  Social Connections: Socially Integrated (07/03/2023)   Received from [facility]   Social Network  How would you rate your social network (family, work, friends)?: Good participation with social networks    Family History  Problem Relation Age of Onset   Diabetes Mother    Hypertension Mother    Heart attack Mother    Diabetes Father    Hypertension Father    Lung disease Father     Health Maintenance  Topic Date Due   Colonoscopy  Never done   Hepatitis B Vaccines (1 of 3 - Risk 3-dose series) Never done   Medicare Annual Wellness (AWV)  04/04/2018   OPHTHALMOLOGY EXAM  07/06/2018   COVID-19 Vaccine (3 - Pfizer risk series) 02/16/2022   Diabetic kidney evaluation - Urine ACR  08/07/2023   HEMOGLOBIN A1C  08/17/2023   INFLUENZA VACCINE  10/05/2023   Diabetic kidney evaluation - eGFR  measurement  04/10/2024   FOOT EXAM  04/19/2024   DTaP/Tdap/Td (3 - Td or Tdap) 12/06/2025   Pneumococcal Vaccine: 50+ Years  Completed   Hepatitis C Screening  Completed   Zoster Vaccines- Shingrix  Completed   HPV VACCINES  Aged Out   Meningococcal B Vaccine  Aged Out     ----------------------------------------------------------------------------------------------------------------------------------------------------------------------------------------------------------------- Physical Exam BP (!) 93/58 (BP Location: Left Arm, Patient Position: Sitting, Cuff Size: Normal)   Pulse (!) 48   Ht 5' 9 (1.753 m)   Wt 271 lb (122.9 kg)   SpO2 96%   BMI 40.02 kg/m   Physical Exam Constitutional:      Appearance: Normal appearance.  HENT:     Head: Normocephalic and atraumatic.   Cardiovascular:     Rate and Rhythm: Normal rate and regular rhythm.  Pulmonary:     Effort: Pulmonary effort is normal.     Breath sounds: Normal breath sounds.   Neurological:     General: No focal deficit present.     Mental Status: He is alert.   Psychiatric:        Mood and Affect: Mood normal.        Behavior: Behavior normal.     ------------------------------------------------------------------------------------------------------------------------------------------------------------------------------------------------------------------- Assessment and Plan  Heart failure with preserved ejection fraction (HCC) Recent hospitalization for CHF exacerbation.  He has had follow-up with cardiology.  Rechecking labs today including proBNP.  Two-view chest x-ray ordered.  Continue Lasix  at current strength.  Blood pressures remain soft at this time with a little hold off on adding Coreg  and or valsartan  back on at this time.  Uncontrolled type 2 diabetes mellitus with hyperglycemia (HCC) Update A1c.  Continue on Ozempic  and Toujeo  at current strength.   No orders of the defined types were  placed in this encounter.   No follow-ups on file.

## 2023-08-29 NOTE — Assessment & Plan Note (Signed)
 Recent hospitalization for CHF exacerbation.  He has had follow-up with cardiology.  Rechecking labs today including proBNP.  Two-view chest x-ray ordered.  Continue Lasix  at current strength.  Blood pressures remain soft at this time with a little hold off on adding Coreg  and or valsartan  back on at this time.

## 2023-08-30 LAB — CBC WITH DIFFERENTIAL/PLATELET
Basophils Absolute: 0.1 10*3/uL (ref 0.0–0.2)
Basos: 1 %
EOS (ABSOLUTE): 0 10*3/uL (ref 0.0–0.4)
Eos: 0 %
Hematocrit: 41.6 % (ref 37.5–51.0)
Hemoglobin: 13.6 g/dL (ref 13.0–17.7)
Immature Grans (Abs): 0 10*3/uL (ref 0.0–0.1)
Immature Granulocytes: 0 %
Lymphocytes Absolute: 1.2 10*3/uL (ref 0.7–3.1)
Lymphs: 15 %
MCH: 30.4 pg (ref 26.6–33.0)
MCHC: 32.7 g/dL (ref 31.5–35.7)
MCV: 93 fL (ref 79–97)
Monocytes Absolute: 0.8 10*3/uL (ref 0.1–0.9)
Monocytes: 10 %
Neutrophils Absolute: 5.7 10*3/uL (ref 1.4–7.0)
Neutrophils: 74 %
Platelets: 235 10*3/uL (ref 150–450)
RBC: 4.47 x10E6/uL (ref 4.14–5.80)
RDW: 14.3 % (ref 11.6–15.4)
WBC: 7.8 10*3/uL (ref 3.4–10.8)

## 2023-08-30 LAB — CMP14+EGFR
ALT: 24 IU/L (ref 0–44)
AST: 27 IU/L (ref 0–40)
Albumin: 3.5 g/dL — ABNORMAL LOW (ref 3.8–4.8)
Alkaline Phosphatase: 80 IU/L (ref 44–121)
BUN/Creatinine Ratio: 11 (ref 10–24)
BUN: 11 mg/dL (ref 8–27)
Bilirubin Total: 1 mg/dL (ref 0.0–1.2)
CO2: 19 mmol/L — ABNORMAL LOW (ref 20–29)
Calcium: 8.8 mg/dL (ref 8.6–10.2)
Chloride: 99 mmol/L (ref 96–106)
Creatinine, Ser: 0.99 mg/dL (ref 0.76–1.27)
Globulin, Total: 3.8 g/dL (ref 1.5–4.5)
Glucose: 102 mg/dL — ABNORMAL HIGH (ref 70–99)
Potassium: 4.5 mmol/L (ref 3.5–5.2)
Sodium: 135 mmol/L (ref 134–144)
Total Protein: 7.3 g/dL (ref 6.0–8.5)
eGFR: 80 mL/min/{1.73_m2} (ref >59)

## 2023-08-30 LAB — HEMOGLOBIN A1C
Est. average glucose Bld gHb Est-mCnc: 140 mg/dL
Hgb A1c MFr Bld: 6.5 % — ABNORMAL HIGH (ref 4.8–5.6)

## 2023-08-30 LAB — PRO B NATRIURETIC PEPTIDE: NT-Pro BNP: 2276 pg/mL — ABNORMAL HIGH (ref 0–376)

## 2023-08-31 ENCOUNTER — Ambulatory Visit: Payer: Self-pay | Admitting: Family Medicine

## 2023-09-11 DIAGNOSIS — Z953 Presence of xenogenic heart valve: Secondary | ICD-10-CM | POA: Diagnosis not present

## 2023-09-11 DIAGNOSIS — I5030 Unspecified diastolic (congestive) heart failure: Secondary | ICD-10-CM | POA: Diagnosis not present

## 2023-09-12 DIAGNOSIS — R0600 Dyspnea, unspecified: Secondary | ICD-10-CM | POA: Diagnosis not present

## 2023-09-12 DIAGNOSIS — E118 Type 2 diabetes mellitus with unspecified complications: Secondary | ICD-10-CM | POA: Diagnosis not present

## 2023-09-12 DIAGNOSIS — R0609 Other forms of dyspnea: Secondary | ICD-10-CM | POA: Diagnosis not present

## 2023-09-12 DIAGNOSIS — I5032 Chronic diastolic (congestive) heart failure: Secondary | ICD-10-CM | POA: Diagnosis not present

## 2023-09-12 DIAGNOSIS — I5030 Unspecified diastolic (congestive) heart failure: Secondary | ICD-10-CM | POA: Diagnosis not present

## 2023-09-19 ENCOUNTER — Other Ambulatory Visit: Payer: Self-pay | Admitting: Family Medicine

## 2023-09-26 ENCOUNTER — Telehealth: Payer: Self-pay | Admitting: Sports Medicine

## 2023-09-26 DIAGNOSIS — R404 Transient alteration of awareness: Secondary | ICD-10-CM | POA: Diagnosis not present

## 2023-09-26 DIAGNOSIS — I469 Cardiac arrest, cause unspecified: Secondary | ICD-10-CM | POA: Diagnosis not present

## 2023-09-26 DIAGNOSIS — R402431 Glasgow coma scale score 3-8, in the field [EMT or ambulance]: Secondary | ICD-10-CM | POA: Diagnosis not present

## 2023-09-26 DIAGNOSIS — I499 Cardiac arrhythmia, unspecified: Secondary | ICD-10-CM | POA: Diagnosis not present

## 2023-09-27 ENCOUNTER — Telehealth: Payer: Self-pay

## 2023-09-27 NOTE — Telephone Encounter (Signed)
 Spoke with Inocente  at [employer] [facility] [phone] Wanting to know if Dr. Alvia is willing to sign the death certificate for this patient.

## 2023-09-28 NOTE — Telephone Encounter (Signed)
 Copied from CRM [id number]. Topic: General - Deceased Patient >> 10/21/2023  9:17 AM Susanna ORN wrote: Name of caller: Inocente  Date of death: N/A  Name of funeral home: [facility]  Phone number of funeral home: [phone]  Provider that needs to sign form: death certificate  Timeline for signing: Inocente states the family has chosen cremation & states it would be helpful to know if Dr. Alvia will sign or not so that she can send it where it needs to be in time due to vacation season.

## 2023-09-28 NOTE — Telephone Encounter (Signed)
 Patient marked deceased in epic. All appointments have been canceled.

## 2023-10-05 DIAGNOSIS — 419620001 Death: Secondary | SNOMED CT | POA: Diagnosis not present

## 2023-10-05 NOTE — Telephone Encounter (Signed)
 Received on call page, patched to [facility], patient found unresponsive today by wife, CPR initiated, when EMS arrived pt in asystole on monitor.  Time of death 20:49. Will fwd to PCP to keep an eye out for NCDAVE.

## 2023-10-05 DEATH — deceased

## 2023-12-03 ENCOUNTER — Ambulatory Visit: Admitting: Urology

## 2023-12-04 ENCOUNTER — Ambulatory Visit: Admitting: Urology
